# Patient Record
Sex: Female | Born: 1960 | Race: White | Hispanic: No | Marital: Married | Smoking: Former smoker
Health system: Southern US, Community
[De-identification: ages and names within clinical notes are randomized; demographics above are authoritative.]

## PROBLEM LIST (undated history)

## (undated) DIAGNOSIS — E669 Obesity, unspecified: Secondary | ICD-10-CM

## (undated) DIAGNOSIS — M1990 Unspecified osteoarthritis, unspecified site: Secondary | ICD-10-CM

## (undated) DIAGNOSIS — F32A Depression, unspecified: Secondary | ICD-10-CM

## (undated) DIAGNOSIS — G5793 Unspecified mononeuropathy of bilateral lower limbs: Secondary | ICD-10-CM

## (undated) DIAGNOSIS — K219 Gastro-esophageal reflux disease without esophagitis: Secondary | ICD-10-CM

## (undated) DIAGNOSIS — I471 Supraventricular tachycardia, unspecified: Secondary | ICD-10-CM

## (undated) DIAGNOSIS — N3010 Interstitial cystitis (chronic) without hematuria: Secondary | ICD-10-CM

## (undated) DIAGNOSIS — G8929 Other chronic pain: Secondary | ICD-10-CM

## (undated) DIAGNOSIS — M549 Dorsalgia, unspecified: Secondary | ICD-10-CM

## (undated) DIAGNOSIS — M722 Plantar fascial fibromatosis: Secondary | ICD-10-CM

## (undated) DIAGNOSIS — F329 Major depressive disorder, single episode, unspecified: Secondary | ICD-10-CM

## (undated) HISTORY — DX: Major depressive disorder, single episode, unspecified: F32.9

## (undated) HISTORY — DX: Gastro-esophageal reflux disease without esophagitis: K21.9

## (undated) HISTORY — DX: Obesity, unspecified: E66.9

## (undated) HISTORY — DX: Interstitial cystitis (chronic) without hematuria: N30.10

## (undated) HISTORY — DX: Depression, unspecified: F32.A

## (undated) HISTORY — PX: MASTECTOMY: SHX3

## (undated) NOTE — *Deleted (*Deleted)
[facility] - Clinic Note  04/26/2020     CHIEF COMPLAINT Patient presents for No chief complaint on file.   HISTORY OF PRESENT ILLNESS: Tamara Terry is a 26 y.o. female who presents to the clinic today for:     Referring physician: Benita Stabile, MD [address] [place],  [place] [address]  HISTORICAL INFORMATION:   Selected notes from the MEDICAL RECORD NUMBER Referred by Dr. Conley Rolls for eval of FOL/floaters OS x 1 wk   CURRENT MEDICATIONS: No current outpatient medications on file. (Ophthalmic Drugs)   No current facility-administered medications for this visit. (Ophthalmic Drugs)   Current Outpatient Medications (Other)  Medication Sig  . cetirizine (ZYRTEC) 10 MG tablet Take 10 mg by mouth daily.  . Fe Fum-FePoly-FA-Vit C-Vit B3 (FOLIVANE-F) 125-1 MG CAPS TAKE 1 CAPSULE BY MOUTH TWICE A DAY (EVERY 12 HOURS)  . HYDROcodone-acetaminophen (NORCO/VICODIN) 5-325 MG tablet TAKE ONE TABLET BY MOUTH EVERY FOUR HOURS  . ibuprofen (ADVIL,MOTRIN) 200 MG tablet Take 400 mg by mouth every 6 (six) hours as needed. For pain  . omeprazole (PRILOSEC) 20 MG capsule Take 1 capsule (20 mg total) by mouth 2 (two) times daily. (Patient not taking: Reported on 11/10/2019)  . omeprazole (PRILOSEC) 20 MG capsule TAKE ONE (1) CAPSULE BY MOUTH TWICE A DAY. (EVERY 12 HOURS.)  . phentermine 30 MG capsule Take 30 mg by mouth every morning.  . sertraline (ZOLOFT) 50 MG tablet Take 50 mg by mouth daily.  . traMADol (ULTRAM) 50 MG tablet Take 50 mg by mouth every evening.   No current facility-administered medications for this visit. (Other)   Facility-Administered Medications Ordered in Other Visits (Other)  Medication Route  . bupivacaine (MARCAINE) 0.5 % 15 mL, phenazopyridine (PYRIDIUM) 400 mg bladder mixture Bladder Instillation      REVIEW OF SYSTEMS:    ALLERGIES Allergies  Allergen Reactions  . Azithromycin   . Latex     PAST MEDICAL HISTORY Past Medical  History:  Diagnosis Date  . Chronic back pain   . Depression   . GERD (gastroesophageal reflux disease)   . Interstitial cystitis   . Neuropathy of both feet   . Obesity   . Plantar fasciitis, bilateral    Past Surgical History:  Procedure Laterality Date  . CHOLECYSTECTOMY  1990  . CYSTO WITH HYDRODISTENSION  11/07/2011   Procedure: CYSTOSCOPY/HYDRODISTENSION;  Surgeon: Marcine Matar, MD;  Location: [facility];  Service: Urology;  Laterality: N/A;  PYRIDIUM AND MARCAINE  . HYSTEROSCOPY WITH D & C  07-21-2004    FAMILY HISTORY Family History  Problem Relation Age of Onset  . Heart disease Mother   . Diabetes Mother   . Hypertension Mother   . Parkinson's disease Mother   . Kidney disease Mother   . Colon polyps Mother   . Heart attack Father   . Diabetes Sister   . Diabetes Maternal Grandmother   . Cancer Maternal Grandfather     SOCIAL HISTORY Social History   Tobacco Use  . Smoking status: Former Smoker    Types: Cigarettes    Quit date: 06/25/1994    Years since quitting: 25.8  . Smokeless tobacco: Never Used  Substance Use Topics  . Alcohol use: No  . Drug use: No         OPHTHALMIC EXAM:  Not recorded     IMAGING AND PROCEDURES  Imaging and Procedures for 04/26/2020  ASSESSMENT/PLAN:  No diagnosis found.  1,2. PVD / vitreous syneresis OU  - symptomatic flashes/floaters after hitting head at work  - intermittent flashes with head movement; floaters mild OU  - Discussed findings and prognosis  - No RT or RD on 360 peripheral exam  - Reviewed s/s of RT/RD  - Strict return precautions for any such RT/RD signs/symptoms  - f/u in 4-6 wks -- DFE/OCT  3. Mixed Cataract OU - The symptoms of cataract, surgical options, and treatments and risks were discussed with patient. - discussed diagnosis and progression - not yet visually significant - monitor for now  4. Hx of Lasik OU - Dr. Delaney Meigs (>20 years ago)     Ophthalmic Meds Ordered this visit:  No orders of the defined types were placed in this encounter.      No follow-ups on file.  There are no Patient Instructions on file for this visit.   Explained the diagnoses, plan, and follow up with the patient and they expressed understanding.  Patient expressed understanding of the importance of proper follow up care.  This document serves as a record of services personally performed by Karie Chimera, MD, PhD. It was created on their behalf by Cristopher Estimable, COT an ophthalmic technician. The creation of this record is the provider's dictation and/or activities during the visit.    Electronically signed by: Cristopher Estimable, COT 10.29.21 @ 9:03 AM  Abbreviations: M myopia (nearsighted); A astigmatism; H hyperopia (farsighted); P presbyopia; Mrx spectacle prescription;  CTL contact lenses; OD right eye; OS left eye; OU both eyes  XT exotropia; ET esotropia; PEK punctate epithelial keratitis; PEE punctate epithelial erosions; DES dry eye syndrome; MGD meibomian gland dysfunction; ATs artificial tears; PFAT's preservative free artificial tears; NSC nuclear sclerotic cataract; PSC posterior subcapsular cataract; ERM epi-retinal membrane; PVD posterior vitreous detachment; RD retinal detachment; DM diabetes mellitus; DR diabetic retinopathy; NPDR non-proliferative diabetic retinopathy; PDR proliferative diabetic retinopathy; CSME clinically significant macular edema; DME diabetic macular edema; dbh dot blot hemorrhages; CWS cotton wool spot; POAG primary open angle glaucoma; C/D cup-to-disc ratio; HVF humphrey visual field; GVF goldmann visual field; OCT optical coherence tomography; IOP intraocular pressure; BRVO Branch retinal vein occlusion; CRVO central retinal vein occlusion; CRAO central retinal artery occlusion; BRAO branch retinal artery occlusion; RT retinal tear; SB scleral buckle; PPV pars plana vitrectomy; VH Vitreous hemorrhage; PRP panretinal laser  photocoagulation; IVK intravitreal kenalog; VMT vitreomacular traction; MH Macular hole;  NVD neovascularization of the disc; NVE neovascularization elsewhere; AREDS age related eye disease study; ARMD age related macular degeneration; POAG primary open angle glaucoma; EBMD epithelial/anterior basement membrane dystrophy; ACIOL anterior chamber intraocular lens; IOL intraocular lens; PCIOL posterior chamber intraocular lens; Phaco/IOL phacoemulsification with intraocular lens placement; PRK photorefractive keratectomy; LASIK laser assisted in situ keratomileusis; HTN hypertension; DM diabetes mellitus; COPD chronic obstructive pulmonary disease

---

## 1988-06-25 HISTORY — PX: CHOLECYSTECTOMY: SHX55

## 2001-06-06 ENCOUNTER — Encounter: Payer: Self-pay | Admitting: *Deleted

## 2001-06-06 ENCOUNTER — Ambulatory Visit (HOSPITAL_COMMUNITY): Admission: RE | Admit: 2001-06-06 | Discharge: 2001-06-06 | Payer: Self-pay | Admitting: *Deleted

## 2002-09-09 ENCOUNTER — Encounter: Payer: Self-pay | Admitting: *Deleted

## 2002-09-09 ENCOUNTER — Ambulatory Visit (HOSPITAL_COMMUNITY): Admission: RE | Admit: 2002-09-09 | Discharge: 2002-09-09 | Payer: Self-pay | Admitting: *Deleted

## 2004-07-05 ENCOUNTER — Ambulatory Visit (HOSPITAL_COMMUNITY): Admission: RE | Admit: 2004-07-05 | Discharge: 2004-07-05 | Payer: Self-pay | Admitting: *Deleted

## 2004-07-21 ENCOUNTER — Ambulatory Visit (HOSPITAL_COMMUNITY): Admission: RE | Admit: 2004-07-21 | Discharge: 2004-07-21 | Payer: Self-pay | Admitting: *Deleted

## 2004-07-21 HISTORY — PX: HYSTEROSCOPY W/D&C: SHX1775

## 2005-10-02 ENCOUNTER — Ambulatory Visit (HOSPITAL_COMMUNITY): Admission: RE | Admit: 2005-10-02 | Discharge: 2005-10-02 | Payer: Self-pay | Admitting: *Deleted

## 2006-09-10 ENCOUNTER — Ambulatory Visit (HOSPITAL_COMMUNITY): Admission: RE | Admit: 2006-09-10 | Discharge: 2006-09-10 | Payer: Self-pay | Admitting: Internal Medicine

## 2008-02-04 ENCOUNTER — Encounter (HOSPITAL_COMMUNITY)
Admission: RE | Admit: 2008-02-04 | Discharge: 2008-03-05 | Payer: Self-pay | Admitting: Physical Medicine and Rehabilitation

## 2008-07-26 ENCOUNTER — Other Ambulatory Visit: Admission: RE | Admit: 2008-07-26 | Discharge: 2008-07-26 | Payer: Self-pay | Admitting: Obstetrics and Gynecology

## 2008-07-28 ENCOUNTER — Ambulatory Visit (HOSPITAL_COMMUNITY): Admission: RE | Admit: 2008-07-28 | Discharge: 2008-07-28 | Payer: Self-pay | Admitting: Obstetrics & Gynecology

## 2008-07-28 LAB — HM MAMMOGRAPHY

## 2009-09-07 ENCOUNTER — Other Ambulatory Visit: Admission: RE | Admit: 2009-09-07 | Discharge: 2009-09-07 | Payer: Self-pay | Admitting: Obstetrics and Gynecology

## 2010-02-01 ENCOUNTER — Ambulatory Visit (HOSPITAL_COMMUNITY): Admission: RE | Admit: 2010-02-01 | Discharge: 2010-02-01 | Payer: Self-pay | Admitting: Obstetrics & Gynecology

## 2010-08-01 ENCOUNTER — Encounter: Payer: Self-pay | Admitting: Gastroenterology

## 2010-08-01 ENCOUNTER — Ambulatory Visit (INDEPENDENT_AMBULATORY_CARE_PROVIDER_SITE_OTHER): Payer: Managed Care, Other (non HMO) | Admitting: Gastroenterology

## 2010-08-01 ENCOUNTER — Encounter: Payer: Self-pay | Admitting: Internal Medicine

## 2010-08-01 DIAGNOSIS — R1013 Epigastric pain: Secondary | ICD-10-CM | POA: Insufficient documentation

## 2010-08-01 DIAGNOSIS — R197 Diarrhea, unspecified: Secondary | ICD-10-CM

## 2010-08-04 LAB — CONVERTED CEMR LAB
Basophils Relative: 0 % (ref 0–1)
Eosinophils Relative: 1 % (ref 0–5)
Hemoglobin: 12.7 g/dL (ref 12.0–15.0)
Lymphocytes Relative: 27 % (ref 12–46)
MCHC: 32.8 g/dL (ref 30.0–36.0)
Monocytes Relative: 8 % (ref 3–12)
Platelets: 265 10*3/uL (ref 150–400)
RDW: 12.4 % (ref 11.5–15.5)
WBC: 10.6 10*3/uL — ABNORMAL HIGH (ref 4.0–10.5)

## 2010-08-10 NOTE — Letter (Signed)
Summary: TCS/EGD ORDER  TCS/EGD ORDER   Imported By: Ave Filter 08/01/2010 16:53:52  _____________________________________________________________________  External Attachment:    Type:   Image     Comment:   External Document

## 2010-08-10 NOTE — Assessment & Plan Note (Signed)
Summary: diarrhea,consult for tcs   Vital Signs:  Patient profile:   50 year old female Height:      65 inches Weight:      215 pounds Temp:     98.2 degrees F Pulse rate:   68 / minute  Visit Type:  Initial Consult Referring Provider:  Dr. Despina Hidden Primary Care Provider:  Dr. Despina Hidden   History of Present Illness: Tamara Terry is a pleasant 50 year old Caucasian female who presents today in consult for diarrhea. Pt reports a 1 year hx of postprandial diarrhea, averaging approximately 7-8 loose stools/day. Denies any lower abdominal cramping, has noted scant amount of brbpr intermittently. s/p chole many years ago. Also reports epigastric pain which radiates to back, achy and intermittent, worsened with eating/drinking. No N/V, denies reflux, dysphagia, loss of appetite. Takes Ibuprofen daily for back pain. No Goodys or BCs.   Current Medications (verified): 1)  Omeprazole 20 Mg Cpdr (Omeprazole) .... Two Times A Day 2)  Estradiol 10.5 Mg .... Daily 3)  Sertraline Hcl 50 Mg Tabs (Sertraline Hcl) .Marland Kitchen.. 1 By Mouth Daily 4)  Zyrtec Allergy 10 Mg Caps (Cetirizine Hcl) .Marland Kitchen.. 1 Daily 5)  Elmiron 100 Mg Caps (Pentosan Polysulfate Sodium) .... 4 Daily 6)  Metanx 3-35-2 Mg Tabs (L-Methylfolate-B6-B12) .... 2 Tabs Daily 7)  Uribel 118 Mg Caps (Meth-Hyo-M Bl-Na Phos-Ph Sal) .... Three Times A Day 8)  Integra Plus  Caps (Fefum-Fepoly-Fa-B Cmp-C-Biot) .... Daily 9)  Hydrocodone-Acetaminophen 5-500 Mg Tabs (Hydrocodone-Acetaminophen) .... As Needed 10)  Ginkgo Biloba 60 Mg Caps (Ginkgo Biloba) .... 2 Daily 11)  K[place] Ginseng 250 Mg Caps (Ginseng) .... 2 Tabs Daily 12)  Ibuprofen 200 Mg Caps (Ibuprofen) .... 2 Tabs Daily  Allergies (verified): 1)  ! * Azithromycin  Past History:  Past Medical History: GERD Depression/anxiety interstitial cystitis chronic back pain  Past Surgical History: Cholecystectomy Hysteroscopy  Family History: Mother:living, DM, heart disease, HTN Father:deceased,  non-contributory Family History of Colon Polyps:mother, ?adenoma, age late 57s  Social History: Occupation: self-employed Married 1 children Patient is a former smoker. quit 16 years ago Alcohol Use - no Illicit Drug Use - no Smoking Status:  quit Drug Use:  no  Review of Systems General:  Denies fever, chills, and anorexia. Eyes:  Denies blurring, irritation, and discharge. ENT:  Denies sore throat, hoarseness, and difficulty swallowing. CV:  Denies chest pains and syncope. Resp:  Denies dyspnea at rest and wheezing. GI:  Complains of abdominal pain and diarrhea; denies difficulty swallowing, pain on swallowing, nausea, jaundice, gas/bloating, and black BMs. GU:  Denies urinary burning and urinary frequency. MS:  Denies joint pain / LOM, joint swelling, and joint stiffness. Derm:  Denies rash, itching, and dry skin. Neuro:  Denies weakness and syncope. Psych:  Denies depression and anxiety. Endo:  Denies cold intolerance and heat intolerance.  Physical Exam  General:  Well developed, well nourished, no acute distress. Head:  Normocephalic and atraumatic. Eyes:  sclera without icterus.  Lungs:  Clear throughout to auscultation. Heart:  Regular rate and rhythm; no murmurs, rubs,  or bruits. Abdomen:  +BS, soft, non-tender, non-distended. no HSM< no rebound or guarding. no masses noted.  Msk:  Symmetrical with no gross deformities. Normal posture. Neurologic:  Alert and  oriented x4;  grossly normal neurologically. Skin:  Intact without significant lesions or rashes. Psych:  Alert and cooperative. Normal mood and affect.   Impression & Recommendations:  Problem # 1:  DIARRHEA (ICD-48.69) 50 year old pleasant Caucasian female with 1 year hx of  worsening postprandial diarrhea, 7-8 loose stools/day. No abdominal pain. +brbpr, scant amount. No prior colonoscopy, presenting for screening colonoscopy. May be component of IBS, possible bile salt diarrhea. Will proceed with  colonoscopy and then offer further rec's.  In interim, fiber of choice daily, samples given Probiotic daily, samples given TCS with Dr. Jena Gauss in near future: the R/B/A have been discussed in detail. stated understanding and wishes to proceed.   Orders: T-CBC w/Diff [phone]) T-Amylase [phone]) T-Lipase [phone]) T-Comprehensive Metabolic Panel [id number]) Consultation Level III (02725)  Problem # 2:  ABDOMINAL PAIN, EPIGASTRIC (ICD-789.06) +epigastric pain, radiating to back X a few months, described as achy, intermittent, worsened with eating/drinking. NSAIDs daily, likely gastritis vs PUD secondary to NSAID use. doubt pancreatic etiology. will assess baseline labs. Pt already on Omeprazole twice/day.   EGD with Dr. Jena Gauss (along with TCS) in near future: the R/B/A have been discussed in detail; she desires to proceed.  Labs as below.  Orders: T-CBC w/Diff [phone]) T-Amylase [phone]) T-Lipase [phone]) T-Comprehensive Metabolic Panel [id number]) Consultation Level III [phone])   Orders Added: 1)  T-CBC w/Diff [[id number]] 2)  T-Amylase [[id number]] 3)  T-Lipase [[id number]] 4)  T-Comprehensive Metabolic Panel [[id number]] 5)  Consultation Level III [83151]    We would like to thank Dr. Despina Hidden for the referral of this nice lady.

## 2010-08-21 ENCOUNTER — Ambulatory Visit (HOSPITAL_COMMUNITY)
Admission: RE | Admit: 2010-08-21 | Discharge: 2010-08-21 | Disposition: A | Discharge status: Home / Self Care | Payer: Managed Care, Other (non HMO) | Source: Ambulatory Visit | Attending: Internal Medicine | Admitting: Internal Medicine

## 2010-08-21 ENCOUNTER — Other Ambulatory Visit: Payer: Self-pay | Admitting: Internal Medicine

## 2010-08-21 ENCOUNTER — Encounter: Payer: Managed Care, Other (non HMO) | Admitting: Internal Medicine

## 2010-08-21 DIAGNOSIS — R1013 Epigastric pain: Secondary | ICD-10-CM | POA: Insufficient documentation

## 2010-08-21 DIAGNOSIS — R197 Diarrhea, unspecified: Secondary | ICD-10-CM

## 2010-08-21 LAB — CLOSTRIDIUM DIFFICILE BY PCR: Toxigenic C. Difficile by PCR: NEGATIVE

## 2010-08-22 LAB — FECAL LACTOFERRIN, QUANT

## 2010-08-23 LAB — OVA AND PARASITE EXAMINATION: Ova and parasites: NONE SEEN

## 2010-08-25 LAB — STOOL CULTURE

## 2010-08-30 NOTE — Op Note (Addendum)
NAME:  Tamara Terry, Tamara Terry                ACCOUNT NO.:  [id number]  MEDICAL RECORD NO.:  [id number]           PATIENT TYPE:  O  LOCATION:  [facility]YP                          FACILITY:  [facility]  PHYSICIAN:  R. Roetta Sessions, M.D. DATE OF BIRTH:  1961-03-05  DATE OF PROCEDURE:  08/21/2010 DATE OF DISCHARGE:                              OPERATIVE REPORT   PROCEDURE:  Diagnostic EGD followed by ileal colonoscopy with biopsy and stool sampling.  INDICATIONS FOR PROCEDURE:  A 50 year old lady with 1-year history of worsening postprandial diarrhea.  She has had some epigastric pain radiating into her back as well, not much improvement on omeprazole twice daily.  Also takes Elmiron for interstitial cystitis.  Recent CBC, amylase, lipase, CMP are normal except for minimally elevated total white count of 10.6.  EGD and colonoscopy now being done.  Risks, benefits, limitations, alternatives, imponderables have been discussed, questions answered.  Please see the documentation in the medical record.  PROCEDURE NOTE:  O2 saturation, blood pressure, pulse, respirations were monitored throughout the entirety of both procedures.  CONSCIOUS SEDATION:  Versed 5 mg IV, Demerol 125 mg IV in divided doses. Cetacaine spray for topical pharyngeal anesthesia.  INSTRUMENT:  Pentax video chip system.  FINDINGS:  EGD examination of tubular esophagus revealed entirely normal- appearing esophageal mucosa.  EG junction easily traversed.  Stomach:  Gas cavity was emptied and insufflated well with air. Thorough examination of gastric mucosa including retroflexion of proximal stomach, esophagogastric junction demonstrated no abnormalities.  Pylorus was patent, easily traversed.  Examination of bulb, second, and third portion revealed no abnormalities.  THERAPEUTIC/DIAGNOSTIC MANEUVERS PERFORMED:  None.  The patient tolerated the procedure well, was prepared for colonoscopy. Digital rectal exam revealed no abnormalities.   Endoscopic findings: Prep was adequate.  Colon:  Colonic mucosa was surveyed from the rectosigmoid junction through the left transverse right colon to the appendiceal orifice, ileocecal valve/cecum.  These structures were well seen and photographed for the record.  Terminal ileum was intubated to 20 cm.  From this level, scope was slowly and cautiously withdrawn.  All previously mucosal surfaces were again seen.  The colonic mucosa as well as terminal mucosa appeared entirely normal.  I took segmental biopsies of the ascending, descending, and rectal segments to rule out microscopic colitis, also fresh stool specimen was submitted to the microbiology lab.  Scope was pulled down to the rectum where a thorough examination of rectal mucosa including retroflexed view of the anal verge demonstrated no abnormalities.  The patient tolerated this procedure well.  Cecal withdrawal time 9 minutes.  IMPRESSION:  Normal esophagus, stomach, D1 through D3.  COLONOSCOPY FINDINGS:  Normal rectum:  Terminal ileum status post segmental biopsies and stool sampling.  The patient's symptoms certainly could be related to irritable bowel syndrome.  Drug-induced diarrhea also possibility (i.e. omeprazole, ibuprofen, and even Elmiron, the latter associated with colitis).  RECOMMENDATIONS:  We will follow up on pending studies, then we will render further recommendations in the very near future.     Jonathon Bellows, M.D.     RMR/MEDQ  D:  08/21/2010  T:  08/21/2010  Job:  [id number]  cc:   Lazaro Arms, M.D. Fax: [phone]  Electronically Signed by Lorrin Goodell M.D. on 08/29/2010 02:42:10 PM Electronically Signed by Lorrin Goodell M.D. on 08/29/2010 51:76:16 PM Electronically Signed by Lorrin Goodell M.D. on 08/29/2010 03:31:11 PM Electronically Signed by Lorrin Goodell M.D. on 08/29/2010 04:05:17 PM Electronically Signed by Lorrin Goodell M.D. on 08/29/2010 04:36:36 PM Electronically Signed by Lorrin Goodell M.D. on 08/29/2010  05:08:10 PM Electronically Signed by Lorrin Goodell M.D. on 08/29/2010 05:08:10 PM Electronically Signed by Lorrin Goodell M.D. on 08/29/2010 05:36:36 PM Electronically Signed by Lorrin Goodell M.D. on 08/29/2010 06:26:06 PM Electronically Signed by Lorrin Goodell M.D. on 08/29/2010 07:36:58 PM

## 2010-09-26 ENCOUNTER — Encounter: Payer: Self-pay | Admitting: Gastroenterology

## 2010-09-27 ENCOUNTER — Ambulatory Visit: Payer: Managed Care, Other (non HMO) | Admitting: Gastroenterology

## 2010-11-10 NOTE — Op Note (Signed)
Tamara Terry                ACCOUNT NO.:  [id number]   MEDICAL RECORD NO.:  [id number]          PATIENT TYPE:  AMB   LOCATION:  DAY                           FACILITY:  [facility]   PHYSICIAN:  Langley Gauss, MD     DATE OF BIRTH:  1960-11-02   DATE OF PROCEDURE:  07/21/2004  DATE OF DISCHARGE:  07/21/2004                                 OPERATIVE REPORT   PREOPERATIVE DIAGNOSES:  1.  Menometrorrhagia.  2.  Dysmenorrhea.   OPERATION/PROCEDURE:  1.  Hysteroscopy.  2.  Dilatation and curettage.   SURGEON:  Langley Gauss, M.D.   ESTIMATED BLOOD LOSS:  Minimal.   SPECIMENS:  Uterine curettings as well as portion of endometrial lining  removed with Randall-Stone forceps for permanent pathology only.   DESCRIPTION OF PROCEDURE:  The patient was taken to the operating room.  Vital signs were stable. The patient underwent uncomplicated induction of  general anesthesia.  She was then placed in the low lithotomy position,  prepped and draped in the usual sterile manner. Speculum was placed in  vaginal vault.  The cervix was visualized.  Initially cervix and uterus  sounded only to a depth of 6 cm, but there was noted to be very irregular  contour of the intrauterine cavity consistent with significant endometrial  tissue present.  Progressive dilatation was then performed up to a size #27  dilator which then does allow passage of the hysteroscope into the lower  uterine segment and uterine cavity.  Under direct visualization appropriate  photographs were taken.  There was dense, thick, endometrial tissue present  with suggestion of a polyp.  Some of this tissue does feel to be dense  enough to be causing loculation.  Throughout the procedure, sterile normal  saline is utilized as diluting medium.  The cystoscope utilized.  Those  results in some limitation of the procedure performed as no biopsy forceps  were available which passed through the lumen and __________ no scissors are  available either. Only instrument which was passed was a very small flexible  ureteral scissors which were insufficiency for obtaining tissue.  Thus,  photographs are taken after which time the hysteroscope is removed and  aggressive curettage is performed of the uterine cavity.  We found it quite  evident that the tissue is very dense and thick with very irregular  intrauterine contour.  Aggressive curettage is performed with large amounts  of tissue obtained.  Curettage is continued until a fine gritty sensation is  appreciated in all quadrants of the uterus and through the uterine fundus.  The hysteroscope was then reinserted which was noted to reveal significant  amounts of the endometrial tissue still present.  Thus, the hysteroscope was  removed.  The Randall-Stone forceps were utilized at which time complete  intrauterine exploration is performed resulting in large amounts of  additional tissue being removed and all sent for permanent section.  Curettage was then again performed with additional tissue obtained.  This  was continued until no further endometrial tissue is obtained.  The  hysteroscope is then reinserted utilizing sterile normal  saline as the  exploration medium.  At this point in time, there are minimal abnormalities  identified.  The endometrial lining is largely curettaged down to near the  basal layer and a fairly smooth intrauterine contour is appreciated.  Uterus  at this point in time is noted to sound to a depth of 10 cm.  There were no  complications which have occurred.  The hysteroscope is then removed.  The  patient is noted to have a first degree cystocele.  There is very excellent  suspension of the uterus with very gentle traction on the cervix.  Bimanual  examination reveals a multiparous size uterus but no adnexal masses.  Excellent hemostasis noted following the procedure. All instruments were  removed.  The patient is reversed from anesthesia.  The  patient was taken to  the recovery room in stable condition.   SUMMARY:  Immediately following the procedure, the operative findings were  discussed with the patient's awaiting husband. Final pathology is currently  pending.  The patient history and physical findings are consistent with  chronic and obvious __________ cycles with unopposed estrogen resulting in  thickening of the endometrial tissue.  I discussed with the patient's  husband that we may do well to perform serial Provera withdrawal in an  effort to regulate the menses.   LABORATORY DATA:  The patient is noted to have a hemoglobin of 12.1, beta  hCG is negative.  She is given a copy of the standard discharge  instructions.  Next scheduled date of work being July 24, 2004 will be  dependent on the patient's overall physical status at that time.  She  however, will otherwise follow up in the office in one week's time.   DISCHARGE MEDICATIONS:  The patient did receive 1 g of IV Ancef immediately  preoperatively.  She was given prescription for Keflex 500 mg p.o. b.i.d.  for seven days and in addition ibuprofen one p.o. q.6h. p.r.n. for  postoperative pelvic cramping.   Postoperatively the patient has no complications.  Vital signs remain  stable.  The patient was discharged to home on same date of service, July 21, 2004.      DC/MEDQ  D:  07/22/2004  T:  07/24/2004  Job:  [id number]

## 2011-04-20 ENCOUNTER — Other Ambulatory Visit: Payer: Self-pay

## 2011-04-20 ENCOUNTER — Encounter (HOSPITAL_COMMUNITY): Payer: Self-pay | Admitting: *Deleted

## 2011-04-20 ENCOUNTER — Emergency Department (HOSPITAL_COMMUNITY): Payer: Managed Care, Other (non HMO)

## 2011-04-20 ENCOUNTER — Emergency Department (HOSPITAL_COMMUNITY)
Admission: EM | Admit: 2011-04-20 | Discharge: 2011-04-20 | Disposition: A | Discharge status: Home / Self Care | Payer: Managed Care, Other (non HMO) | Attending: Emergency Medicine | Admitting: Emergency Medicine

## 2011-04-20 DIAGNOSIS — M542 Cervicalgia: Secondary | ICD-10-CM | POA: Insufficient documentation

## 2011-04-20 DIAGNOSIS — R079 Chest pain, unspecified: Secondary | ICD-10-CM | POA: Insufficient documentation

## 2011-04-20 DIAGNOSIS — F3289 Other specified depressive episodes: Secondary | ICD-10-CM | POA: Insufficient documentation

## 2011-04-20 DIAGNOSIS — M62838 Other muscle spasm: Secondary | ICD-10-CM

## 2011-04-20 DIAGNOSIS — K219 Gastro-esophageal reflux disease without esophagitis: Secondary | ICD-10-CM | POA: Insufficient documentation

## 2011-04-20 DIAGNOSIS — Z87891 Personal history of nicotine dependence: Secondary | ICD-10-CM | POA: Insufficient documentation

## 2011-04-20 DIAGNOSIS — F329 Major depressive disorder, single episode, unspecified: Secondary | ICD-10-CM | POA: Insufficient documentation

## 2011-04-20 DIAGNOSIS — M549 Dorsalgia, unspecified: Secondary | ICD-10-CM | POA: Insufficient documentation

## 2011-04-20 DIAGNOSIS — G8929 Other chronic pain: Secondary | ICD-10-CM | POA: Insufficient documentation

## 2011-04-20 LAB — BASIC METABOLIC PANEL
Calcium: 10.5 mg/dL (ref 8.4–10.5)
Chloride: 103 mEq/L (ref 96–112)
Glucose, Bld: 94 mg/dL (ref 70–99)

## 2011-04-20 LAB — CBC
HCT: 37.2 % (ref 36.0–46.0)
RBC: 3.98 MIL/uL (ref 3.87–5.11)
RDW: 11.9 % (ref 11.5–15.5)

## 2011-04-20 LAB — POCT I-STAT TROPONIN I: Troponin i, poc: 0 ng/mL (ref 0.00–0.08)

## 2011-04-20 MED ORDER — METHOCARBAMOL 500 MG PO TABS: 1000.0000 mg | ORAL_TABLET | Freq: Four times a day (QID) | ORAL | Status: AC | PRN

## 2011-04-20 MED ORDER — OXYCODONE-ACETAMINOPHEN 5-325 MG PO TABS
1.0000 | ORAL_TABLET | Freq: Once | ORAL | Status: AC
  Administered 2011-04-20: 1 via ORAL
  Filled 2011-04-20: qty 1

## 2011-04-20 MED ORDER — KETOROLAC TROMETHAMINE 30 MG/ML IJ SOLN
15.0000 mg | Freq: Once | INTRAMUSCULAR | Status: AC
  Administered 2011-04-20: 15 mg via INTRAVENOUS
  Filled 2011-04-20: qty 1

## 2011-04-20 NOTE — ED Provider Notes (Signed)
Scribed for [name], DO, the patient was seen in room APA09/APA09 . This chart was scribed by Ellie Lunch.   CSN: [id number] Arrival date & time: 04/20/2011 11:43 AM   Chief Complaint  Patient presents with  . Left neck/ arm pain     The history is provided by the patient. No language interpreter was used.   Pt seen at 2:00.   Tamara Terry is a 50 y.o. female who presents to the Emergency Department complaining of gradual onset and persistence of constant left sided neck pain x2 days. 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  Pt describes pain as "sharp," "cramping," and says it radiates into her left upper anterior chest and shoulder/upper arm. Pt also reports some associated SOB this morning.  Pt treated pain with ibuprofen this morning with little relief. Pt has hx of same symptoms today.  Denies recent injury, no manipulation, no fevers, no rash, no palpitations, no cough, no back pain, no abd pain, no focal motor weakness, no tingling/numbness in extremities, no visual changes.      Past Medical History  Diagnosis Date  . GERD (gastroesophageal reflux disease)   . Depression   . Interstitial cystitis   . Back pain     chronic    Past Surgical History  Procedure Date  . Cholecystectomy   . Hysteroscopy     Family History  Problem Relation Age of Onset  . Heart disease Mother   . Diabetes Mother   . Hypertension Mother   . Colon polyps Other     History  Substance Use Topics  . Smoking status: Former Smoker    Quit date: 06/25/1994  . Smokeless tobacco: Not on file  . Alcohol Use: No   Review of Systems ROS: Statement: All systems negative except as marked or noted in the HPI; Constitutional: Negative for fever and chills. ; ; Eyes: Negative for eye pain, redness and discharge. ; ; ENMT: Negative for ear pain, hoarseness, nasal congestion, sinus pressure and sore throat. ; ;  Cardiovascular: +SOB. Negative for chest pain, palpitations, diaphoresis, and peripheral edema. ; ; Respiratory: Negative for cough, wheezing and stridor. ; ; Gastrointestinal: Negative for nausea, vomiting, diarrhea and abdominal pain, blood in stool, hematemesis, jaundice and rectal bleeding. . ; ; Genitourinary: Negative for dysuria, flank pain and hematuria. ; ; Musculoskeletal: Negative for back pain and +neck pain. Negative for swelling and trauma.; ; Skin: Negative for pruritus, rash, abrasions, blisters, bruising and skin lesion.; ; Neuro: Negative for headache, lightheadedness and neck stiffness. Negative for weakness, altered level of consciousness , altered mental status, extremity weakness, paresthesias, involuntary movement, seizure and syncope.    Allergies  Azithromycin and Latex  Home Medications   Current Outpatient Rx  Name Route Sig Dispense Refill  . CETIRIZINE HCL 10 MG PO CHEW Oral Chew 10 mg by mouth daily.      Marland Kitchen ESTRADIOL 1.5 MG PO TABS Oral Take 1.5 mg by mouth daily.      Arnette Schaumann PLUS PO CAPS Oral Take 1 capsule by mouth daily.      Marland Kitchen GINKGO 60 MG PO TABS Oral Take 120 mg by mouth daily.      Marland Kitchen HYDROCODONE-ACETAMINOPHEN 5-500 MG PO TABS Oral Take 1 tablet by mouth every 6 (six) hours as needed.      . IBUPROFEN 200 MG PO TABS Oral Take 400 mg by mouth every 6 (  six) hours as needed.      Marland Kitchen KOREAN GINSENG 250 MG PO CAPS Oral Take 2 capsules by mouth daily.      Marland Kitchen METANX 3-35-2 MG PO TABS Oral Take 2 tablets by mouth daily.      Marland Kitchen URIBEL 118 MG PO CAPS Oral Take 1 capsule by mouth 3 (three) times daily.      Marland Kitchen OMEPRAZOLE 20 MG PO CPDR Oral Take 20 mg by mouth daily.      Marland Kitchen PENTOSAN POLYSULFATE SODIUM 100 MG PO CAPS Oral Take 100 mg by mouth 4 (four) times daily.      . SERTRALINE HCL 50 MG PO TABS Oral Take 50 mg by mouth daily.        BP 98/61  Pulse 56  Temp(Src) 97.7 F (36.5 C) (Oral)  Resp 20  Ht 5\' 6"  (1.676 m)  Wt 220 lb (99.791 kg)  BMI 35.51 kg/m2   SpO2 98%  Physical Exam 1405:  Physical examination:  Nursing notes reviewed; Vital signs and O2 SAT reviewed;  Constitutional: Well developed, Well nourished, Well hydrated, In no acute distress; Head:  Normocephalic, atraumatic; Eyes: EOMI, PERRL, No scleral icterus; ENMT: Mouth and pharynx normal, Mucous membranes moist; Neck: Supple, Full range of motion, No lymphadenopathy; Cardiovascular: Regular rate and rhythm, No murmur, rub, or gallop; Respiratory: Breath sounds clear & equal bilaterally, No rales, rhonchi, wheezes, or rub, Normal respiratory effort/excursion; Chest: +TTP left upper chest wall area.  No soft tissue crepitus, Movement normal; Abdomen: Soft, Nontender, Nondistended, Normal bowel sounds; Spine:  No midline CS, TS, LS tenderness.  +TTP left hypertonic trapezius muscle.  Extremities: Pulses normal, No tenderness, No edema, No calf edema or asymmetry.; Neuro: AA&Ox3, Major CN grossly intact.  No facial droop, speech clear.  No gross focal motor or sensory deficits in extremities.; Skin: Color normal, Warm, Dry, no rash, no petechiae.  Psych:  Affect flat, poor eye contact.    ED Course  Procedures   MDM  MDM Reviewed: nursing note and vitals Interpretation: ECG, labs, x-ray and CT scan    Date: 04/20/2011  Rate: 59  Rhythm: normal sinus rhythm  QRS Axis: normal  Intervals: normal  ST/T Wave abnormalities: normal  Conduction Disutrbances:none  Narrative Interpretation:   Old EKG Reviewed: none available.   Results for orders placed during the hospital encounter of 04/20/11  CBC      Component Value Range   WBC 8.2  4.0 - 10.5 (K/uL)   RBC 3.98  3.87 - 5.11 (MIL/uL)   Hemoglobin 12.5  12.0 - 15.0 (g/dL)   HCT 40.9  81.1 - 91.4 (%)   MCV 93.5  78.0 - 100.0 (fL)   MCH 31.4  26.0 - 34.0 (pg)   MCHC 33.6  30.0 - 36.0 (g/dL)   RDW 78.2  95.6 - 21.3 (%)   Platelets 264  150 - 400 (K/uL)  BASIC METABOLIC PANEL      Component Value Range   Sodium 139  135 - 145  (mEq/L)   Potassium 3.8  3.5 - 5.1 (mEq/L)   Chloride 103  96 - 112 (mEq/L)   CO2 28  19 - 32 (mEq/L)   Glucose, Bld 94  70 - 99 (mg/dL)   BUN 16  6 - 23 (mg/dL)   Creatinine, Ser 0.86  0.50 - 1.10 (mg/dL)   Calcium 57.8  8.4 - 10.5 (mg/dL)   GFR calc non Af Amer >90  >90 (mL/min)   GFR calc Af  Amer >90  >90 (mL/min)  POCT I-STAT TROPONIN I      Component Value Range   Troponin i, poc 0.00  0.00 - 0.08 (ng/mL)   Comment 3           D-DIMER, QUANTITATIVE      Component Value Range   D-Dimer, Quant 0.25  0.00 - 0.48 (ug/mL-FEU)   Dg Chest 2 View  04/20/2011  *RADIOLOGY REPORT*  Clinical Data: Left chest pain.  CHEST - 2 VIEW  Comparison: None.  Findings: Lungs are clear.  Heart size is normal.  No pneumothorax or effusion.  IMPRESSION: No acute disease.  Original Report Authenticated By: Bernadene Bell. Maricela Curet, M.D.   Ct Cervical Spine Wo Contrast  04/20/2011  *RADIOLOGY REPORT*  Clinical Data: Left-sided neck pain that radiates to the left arm.  CT CERVICAL SPINE WITHOUT CONTRAST  Technique:  Multidetector CT imaging of the cervical spine was performed. Multiplanar CT image reconstructions were also generated.  Comparison: None.  Findings: The lung apices are clear.  No evidence for acute fracture or dislocation.  Visualized mastoid air cells are clear. Normal alignment of the cervical spine including the cervicothoracic junction.  Mild degenerative facet changes in the upper cervical spine.  No gross abnormality in the posterior fossa of the brain.  No gross soft tissue abnormalities in the neck. There is no significant bony foraminal narrowing.  IMPRESSION: Mild degenerative changes in the cervical spine.  No acute bony abnormality.  Original Report Authenticated By: Richarda Overlie, M.D.   3:42 PM:  Feels improved after meds and wants to go home now.  Appears msk pain at this time, states "that's what I thought it was."  Endorses hx of same, "but I just wanted to get it checked out."  Dx testing  d/w pt and family.  Questions answered.  Verb understanding, agreeable to d/c home with outpt f/u.   [facility] M  I personally performed the services described in this documentation, which was scribed in my presence. The recorded information has been reviewed and considered.        [name], DO 04/20/11 Flossie Buffy

## 2011-04-20 NOTE — ED Notes (Signed)
Left neck and left arm pain x 2 days. Pt states she became clammy and short of breath at 1000, lasting for 45 min. While at work. Pt  States current pain to neck and arm at 5. Denies pain to chest at this time. Pain rated at 5. No known injury.

## 2011-04-20 NOTE — ED Notes (Signed)
Going Home!

## 2011-04-20 NOTE — ED Notes (Signed)
Gave warm blanket, complained of being cold.

## 2011-04-26 ENCOUNTER — Other Ambulatory Visit (HOSPITAL_COMMUNITY)
Admission: RE | Admit: 2011-04-26 | Discharge: 2011-04-26 | Disposition: A | Discharge status: Home / Self Care | Payer: Managed Care, Other (non HMO) | Source: Ambulatory Visit | Attending: Obstetrics and Gynecology | Admitting: Obstetrics and Gynecology

## 2011-04-26 ENCOUNTER — Other Ambulatory Visit: Payer: Self-pay | Admitting: Adult Health

## 2011-04-26 DIAGNOSIS — Z01419 Encounter for gynecological examination (general) (routine) without abnormal findings: Secondary | ICD-10-CM | POA: Insufficient documentation

## 2011-10-29 ENCOUNTER — Other Ambulatory Visit: Payer: Self-pay | Admitting: Urology

## 2011-10-31 MED ORDER — PHENAZOPYRIDINE HCL 200 MG PO TABS: Freq: Once | ORAL | Status: AC

## 2011-11-02 ENCOUNTER — Encounter (HOSPITAL_BASED_OUTPATIENT_CLINIC_OR_DEPARTMENT_OTHER): Payer: Self-pay | Admitting: *Deleted

## 2011-11-05 ENCOUNTER — Encounter (HOSPITAL_BASED_OUTPATIENT_CLINIC_OR_DEPARTMENT_OTHER): Payer: Self-pay | Admitting: *Deleted

## 2011-11-05 NOTE — Progress Notes (Signed)
NPO AFTER MN. ARRIVES AT 1115. NEEDS HG. WILL TAKE PRILOSEC AM OF SURG. W/ SIP OF WATER.

## 2011-11-06 NOTE — H&P (Signed)
H&P  Chief Complaint: Female pelvic pain  History of Present Illness: Tamara Terry is a 51 y.o. year old female with the following history:  History of Present Illness  This 51 year old female originally saw me on 10/26/2011 for evaluation and management of urinary and pelvic symptoms. She has been told over the past 4 years or so that she had interstitial cystitis. She sees Dr. Despina Hidden in [place], who is her gynecologist. 4 years ago, she began having right pelvic/lower bowel pain, as well as frequency, urgency and dysuria. She has had intermittent but persistent symptoms since that time. She has urinary frequency up to 20-30 times a day, nocturia x6-7, and has dyspareunia, which has precluded intercourse for the past few years. She has been on uribel 4 times daily, as well as Elmiron. She has not had improvement. Apparently, she has had dietary instruction, and follows a fairly strict diet. She denies significant bowel symptoms. She denies gross hematuria. She was treated for UTI recently. She also states that she has been on DMSO treatments as well, about every 6 weeks. Apparently, these work better initially then recently.   She has not seen a urologist before for this. PUF score is 35.  At her office visit then, it was recommended that she have cystoscopy and HOD. She presents at this time for that procedure.    Past Medical History  Diagnosis Date  . GERD (gastroesophageal reflux disease)   . Depression   . Interstitial cystitis   . Neuropathy of both feet   . Chronic back pain   . Plantar fasciitis, bilateral     Past Surgical History  Procedure Date  . Hysteroscopy w/d&c 07-21-2004  . Cholecystectomy 1990    Home Medications:  No prescriptions prior to admission    Allergies:  Allergies  Allergen Reactions  . Azithromycin   . Latex     Family History  Problem Relation Age of Onset  . Heart disease Mother   . Diabetes Mother   . Hypertension Mother   . Colon polyps  Other     Social History:  reports that she quit smoking about 17 years ago. She has never used smokeless tobacco. She reports that she does not drink alcohol or use illicit drugs.   Review of Systems  Genitourinary: urinary frequency, urinary urgency, dysuria, nocturia and incontinence.  Gastrointestinal: diarrhea.  Constitutional: night sweats and feeling tired (fatigue).  Musculoskeletal: back pain.     Physical Exam Constitutional: Well nourished and well developed . No acute distress.  ENT:. The ears and nose are normal in appearance.  Neck: The appearance of the neck is normal and no neck mass is present.  Pulmonary: No respiratory distress and normal respiratory rhythm and effort.  Cardiovascular: Heart rate and rhythm are normal . No peripheral edema.  Abdomen: The abdomen is mildly obese. The abdomen is soft and nontender. No masses are palpated. No CVA tenderness. No hernias are palpable. No hepatosplenomegaly noted.  Genitourinary: Examination of the external genitalia shows normal female external genitalia and no lesions. The urethra is normal in appearance. There is no urethral mass. Vaginal exam demonstrates no abnormalities and the vaginal epithelium to be well estrogenized. No cystocele is identified. No rectocele is identified. The cervix is is without abnormalities. The uterus is without abnormalities and non tender. The bladder is normal on palpation and non tender. The anus is normal on inspection. The perineum is normal on inspection.  The patient's pelvic floor was tender. I did  not appreciate any specific bladder tenderness, however.     Laboratory Data:  No results found for this or any previous visit (from the past 24 hour(s)). No results found for this or any previous visit (from the past 240 hour(s)). Creatinine: No results found for this basename: CREATININE:7 in the last 168 hours  Radiologic Imaging: No results found.  Impression/Assessment:  Possible  interstitial cystitis  Plan:  Cystoscopy/HOD, instill marcaine and pyridium  Chelsea Aus 11/06/2011, 9:40 PM  Bertram Millard. Leahann Lempke MD

## 2011-11-07 ENCOUNTER — Ambulatory Visit (HOSPITAL_BASED_OUTPATIENT_CLINIC_OR_DEPARTMENT_OTHER): Payer: Managed Care, Other (non HMO) | Admitting: Anesthesiology

## 2011-11-07 ENCOUNTER — Encounter (HOSPITAL_BASED_OUTPATIENT_CLINIC_OR_DEPARTMENT_OTHER): Payer: Self-pay | Admitting: *Deleted

## 2011-11-07 ENCOUNTER — Encounter (HOSPITAL_BASED_OUTPATIENT_CLINIC_OR_DEPARTMENT_OTHER): Payer: Self-pay | Admitting: Anesthesiology

## 2011-11-07 ENCOUNTER — Encounter (HOSPITAL_BASED_OUTPATIENT_CLINIC_OR_DEPARTMENT_OTHER)
Admission: RE | Disposition: A | Discharge status: Home / Self Care | Payer: Self-pay | Source: Ambulatory Visit | Attending: Urology

## 2011-11-07 ENCOUNTER — Ambulatory Visit (HOSPITAL_BASED_OUTPATIENT_CLINIC_OR_DEPARTMENT_OTHER)
Admission: RE | Admit: 2011-11-07 | Discharge: 2011-11-07 | Disposition: A | Discharge status: Home / Self Care | Payer: Managed Care, Other (non HMO) | Source: Ambulatory Visit | Attending: Urology | Admitting: Urology

## 2011-11-07 DIAGNOSIS — N3010 Interstitial cystitis (chronic) without hematuria: Secondary | ICD-10-CM | POA: Insufficient documentation

## 2011-11-07 DIAGNOSIS — K219 Gastro-esophageal reflux disease without esophagitis: Secondary | ICD-10-CM | POA: Insufficient documentation

## 2011-11-07 HISTORY — DX: Dorsalgia, unspecified: M54.9

## 2011-11-07 HISTORY — DX: Plantar fascial fibromatosis: M72.2

## 2011-11-07 HISTORY — DX: Other chronic pain: G89.29

## 2011-11-07 HISTORY — DX: Unspecified mononeuropathy of bilateral lower limbs: G57.93

## 2011-11-07 HISTORY — PX: CYSTO WITH HYDRODISTENSION: SHX5453

## 2011-11-07 SURGERY — CYSTOSCOPY, WITH BLADDER HYDRODISTENSION
Anesthesia: General | Site: Bladder | Wound class: Clean Contaminated

## 2011-11-07 MED ORDER — ACETAMINOPHEN 650 MG RE SUPP: 650.0000 mg | RECTAL | Status: DC | PRN

## 2011-11-07 MED ORDER — MORPHINE SULFATE 4 MG/ML IJ SOLN: 4.0000 mg | INTRAMUSCULAR | Status: DC | PRN

## 2011-11-07 MED ORDER — FENTANYL CITRATE 0.05 MG/ML IJ SOLN: 25.0000 ug | INTRAMUSCULAR | Status: DC | PRN

## 2011-11-07 MED ORDER — LACTATED RINGERS IV SOLN: INTRAVENOUS | Status: DC

## 2011-11-07 MED ORDER — FENTANYL CITRATE 0.05 MG/ML IJ SOLN
INTRAMUSCULAR | Status: DC | PRN
  Administered 2011-11-07: 25 ug via INTRAVENOUS
  Administered 2011-11-07: 50 ug via INTRAVENOUS
  Administered 2011-11-07: 25 ug via INTRAVENOUS

## 2011-11-07 MED ORDER — ONDANSETRON HCL 4 MG/2ML IJ SOLN
INTRAMUSCULAR | Status: DC | PRN
  Administered 2011-11-07: 4 mg via INTRAVENOUS

## 2011-11-07 MED ORDER — PHENAZOPYRIDINE HCL 200 MG PO TABS
ORAL | Status: DC | PRN
  Administered 2011-11-07: 13:00:00 via INTRAVESICAL

## 2011-11-07 MED ORDER — LIDOCAINE HCL (CARDIAC) 20 MG/ML IV SOLN
INTRAVENOUS | Status: DC | PRN
  Administered 2011-11-07: 60 mg via INTRAVENOUS

## 2011-11-07 MED ORDER — SODIUM CHLORIDE 0.9 % IV SOLN: 250.0000 mL | INTRAVENOUS | Status: DC | PRN

## 2011-11-07 MED ORDER — SODIUM CHLORIDE 0.9 % IJ SOLN: 3.0000 mL | INTRAMUSCULAR | Status: DC | PRN

## 2011-11-07 MED ORDER — DEXAMETHASONE SODIUM PHOSPHATE 4 MG/ML IJ SOLN
INTRAMUSCULAR | Status: DC | PRN
  Administered 2011-11-07: 8 mg via INTRAVENOUS

## 2011-11-07 MED ORDER — CEFAZOLIN SODIUM 1-5 GM-% IV SOLN: 1.0000 g | INTRAVENOUS | Status: DC

## 2011-11-07 MED ORDER — SODIUM CHLORIDE 0.9 % IJ SOLN: 3.0000 mL | Freq: Two times a day (BID) | INTRAMUSCULAR | Status: DC

## 2011-11-07 MED ORDER — PROMETHAZINE HCL 25 MG/ML IJ SOLN: 6.2500 mg | INTRAMUSCULAR | Status: DC | PRN

## 2011-11-07 MED ORDER — STERILE WATER FOR IRRIGATION IR SOLN
Status: DC | PRN
  Administered 2011-11-07: 3000 mL

## 2011-11-07 MED ORDER — PROPOFOL 10 MG/ML IV EMUL
INTRAVENOUS | Status: DC | PRN
  Administered 2011-11-07: 200 mg via INTRAVENOUS

## 2011-11-07 MED ORDER — OXYCODONE-ACETAMINOPHEN 5-325 MG PO TABS: 1.0000 | ORAL_TABLET | ORAL | Status: AC | PRN

## 2011-11-07 MED ORDER — ACETAMINOPHEN 325 MG PO TABS: 650.0000 mg | ORAL_TABLET | ORAL | Status: DC | PRN

## 2011-11-07 MED ORDER — LACTATED RINGERS IV SOLN
INTRAVENOUS | Status: DC
  Administered 2011-11-07: 100 mL/h via INTRAVENOUS
  Administered 2011-11-07: 12:00:00 via INTRAVENOUS

## 2011-11-07 MED ORDER — ONDANSETRON HCL 4 MG/2ML IJ SOLN: 4.0000 mg | Freq: Four times a day (QID) | INTRAMUSCULAR | Status: DC | PRN

## 2011-11-07 MED ORDER — KETOROLAC TROMETHAMINE 30 MG/ML IJ SOLN
INTRAMUSCULAR | Status: DC | PRN
  Administered 2011-11-07: 30 mg via INTRAVENOUS

## 2011-11-07 MED ORDER — CEFAZOLIN SODIUM-DEXTROSE 2-3 GM-% IV SOLR
2.0000 g | INTRAVENOUS | Status: AC
  Administered 2011-11-07: 2 g via INTRAVENOUS

## 2011-11-07 MED ORDER — BELLADONNA ALKALOIDS-OPIUM 16.2-60 MG RE SUPP
RECTAL | Status: DC | PRN
  Administered 2011-11-07: 1 via RECTAL

## 2011-11-07 MED ORDER — MIDAZOLAM HCL 5 MG/5ML IJ SOLN
INTRAMUSCULAR | Status: DC | PRN
  Administered 2011-11-07: 2 mg via INTRAVENOUS

## 2011-11-07 MED ORDER — OXYCODONE HCL 5 MG PO TABS
5.0000 mg | ORAL_TABLET | ORAL | Status: DC | PRN
  Administered 2011-11-07: 5 mg via ORAL

## 2011-11-07 SURGICAL SUPPLY — 22 items
BAG DRAIN URO-CYSTO SKYTR STRL (DRAIN) ×2 IMPLANT
BAG DRN UROCATH (DRAIN) ×1
CANISTER SUCT LVC 12 LTR MEDI- (MISCELLANEOUS) ×1 IMPLANT
CATH SILICONE 16FRX5CC (CATHETERS) ×1 IMPLANT
CLOTH BEACON ORANGE TIMEOUT ST (SAFETY) ×2 IMPLANT
DRAPE CAMERA CLOSED 9X96 (DRAPES) ×2 IMPLANT
ELECT REM PT RETURN 9FT ADLT (ELECTROSURGICAL)
ELECTRODE REM PT RTRN 9FT ADLT (ELECTROSURGICAL) ×1 IMPLANT
GLOVE BIO SURGEON STRL SZ8 (GLOVE) ×2 IMPLANT
GLOVE BIOGEL PI IND STRL 6.5 (GLOVE) IMPLANT
GLOVE BIOGEL PI INDICATOR 6.5 (GLOVE) ×2
GLOVE SURG SS PI 8.0 STRL IVOR (GLOVE) ×1 IMPLANT
GOWN STRL REIN XL XLG (GOWN DISPOSABLE) ×2 IMPLANT
GOWN SURGICAL LARGE (GOWNS) ×1 IMPLANT
GOWN XL W/COTTON TOWEL STD (GOWNS) ×2 IMPLANT
NDL SAFETY ECLIPSE 18X1.5 (NEEDLE) IMPLANT
NEEDLE HYPO 18GX1.5 SHARP (NEEDLE) ×2
NEEDLE HYPO 22GX1.5 SAFETY (NEEDLE) IMPLANT
NS IRRIG 500ML POUR BTL (IV SOLUTION) IMPLANT
PACK CYSTOSCOPY (CUSTOM PROCEDURE TRAY) ×2 IMPLANT
SYR 20CC LL (SYRINGE) ×1 IMPLANT
WATER STERILE IRR 3000ML UROMA (IV SOLUTION) ×2 IMPLANT

## 2011-11-07 NOTE — Op Note (Signed)
Preoperative diagnosis: Possible interstitial cystitis  Postoperative diagnosis: Interstitial cystitis  Procedure: Anesthetic cystoscopy, HOD, installation of gradient/Marcaine  Surgeon: Vivyan Biggers  Anesthesia: Gen. LMA  Complications: None  Indications: 51 year-old female with pelvic pain and lower urinary tract symptoms. She has been treated for some time for interstitial cystitis. She has had incomplete response to conservative therapy. She recently consult with me, her first visit to a urologist recently. I recommended cystoscopy and HOD. Risks and complications of the procedure were discussed with the patient who desires to proceed.  Description of procedure: The patient was identified in the holding area and received preoperative IV antibiotics. She was taken the operating room where general anesthetic was administered using the LMA. She was placed in the dorsolithotomy position. Genitalia and perineum were prepped and draped. Timeout was then performed.  Pelvic exam revealed a normal feeling uterus and cervix. There were no pelvic masses. A 22 French panendoscope was advanced into her bladder. The bladder was inspected circumferentially. Ureteral orifices were normal in configuration and location. There were no significant trabeculations foreign bodies or urothelial abnormalities. Following this, with the bag of irrigant 700 mm over her bladder level, her bladder was filled to capacity. The bladder was left filled for approximately 8 minutes. Following this, the bladder was drained. Approximately 900 cc of water was obtained from the bladder. Inspection of the bladder revealed a few glomerulations. There was no active bleeding. No biopsies were taken. At this point, the bladder was drained and the peridium/Marcaine solution was placed within the bladder. The patient was awakened following a B&O suppository being place. She was taken the PACU in stable condition.

## 2011-11-07 NOTE — Transfer of Care (Signed)
Immediate Anesthesia Transfer of Care Note  Patient: Tamara Terry  Procedure(s) Performed: Procedure(s) (LRB): CYSTOSCOPY/HYDRODISTENSION (N/A)  Patient Location: PACU  Anesthesia Type: General  Level of Consciousness: awake, oriented, sedated and patient cooperative  Airway & Oxygen Therapy: Patient Spontanous Breathing and Patient connected to face mask oxygen  Post-op Assessment: Report given to PACU RN and Post -op Vital signs reviewed and stable  Post vital signs: Reviewed and stable  Complications: No apparent anesthesia complications

## 2011-11-07 NOTE — Anesthesia Procedure Notes (Signed)
Procedure Name: LMA Insertion Date/Time: 11/07/2011 12:29 PM Performed by: Renella Cunas D Pre-anesthesia Checklist: Patient identified, Emergency Drugs available, Suction available and Patient being monitored Patient Re-evaluated:Patient Re-evaluated prior to inductionOxygen Delivery Method: Circle System Utilized Preoxygenation: Pre-oxygenation with 100% oxygen Intubation Type: IV induction Ventilation: Mask ventilation without difficulty LMA: LMA inserted LMA Size: 4.0 Number of attempts: 1 Airway Equipment and Method: bite block Placement Confirmation: positive ETCO2 Tube secured with: Tape Dental Injury: Teeth and Oropharynx as per pre-operative assessment

## 2011-11-07 NOTE — Anesthesia Preprocedure Evaluation (Addendum)
Anesthesia Evaluation  Patient identified by MRN, date of birth, ID band Patient awake    Reviewed: Allergy & Precautions, H&P , NPO status , Patient's Chart, lab work & pertinent test results  Airway Mallampati: II TM Distance: >3 FB Neck ROM: Full    Dental No notable dental hx.    Pulmonary neg pulmonary ROS,  breath sounds clear to auscultation  Pulmonary exam normal       Cardiovascular negative cardio ROS  Rhythm:Regular Rate:Normal     Neuro/Psych PSYCHIATRIC DISORDERS Depression Chronic back pain, peripheral neuropathy negative neurological ROS  negative psych ROS   GI/Hepatic Neg liver ROS, GERD-  Medicated and Controlled,  Endo/Other    Renal/GU negative Renal ROS  negative genitourinary   Musculoskeletal negative musculoskeletal ROS (+)   Abdominal   Peds  Hematology negative hematology ROS (+)   Anesthesia Other Findings   Reproductive/Obstetrics negative OB ROS                         Anesthesia Physical Anesthesia Plan  ASA: II  Anesthesia Plan: General   Post-op Pain Management:    Induction: Intravenous  Airway Management Planned: LMA  Additional Equipment:   Intra-op Plan:   Post-operative Plan: Extubation in OR  Informed Consent: I have reviewed the patients History and Physical, chart, labs and discussed the procedure including the risks, benefits and alternatives for the proposed anesthesia with the patient or authorized representative who has indicated his/her understanding and acceptance.   Dental advisory given  Plan Discussed with: CRNA  Anesthesia Plan Comments:         Anesthesia Quick Evaluation

## 2011-11-07 NOTE — Anesthesia Postprocedure Evaluation (Signed)
  Anesthesia Post-op Note  Patient: Tamara Terry  Procedure(s) Performed: Procedure(s) (LRB): CYSTOSCOPY/HYDRODISTENSION (N/A)  Patient Location: PACU  Anesthesia Type: General  Level of Consciousness: awake and alert   Airway and Oxygen Therapy: Patient Spontanous Breathing  Post-op Pain: mild  Post-op Assessment: Post-op Vital signs reviewed, Patient's Cardiovascular Status Stable, Respiratory Function Stable, Patent Airway and No signs of Nausea or vomiting  Post-op Vital Signs: stable  Complications: No apparent anesthesia complications

## 2011-11-07 NOTE — Discharge Instructions (Signed)
CYSTOSCOPY HOME CARE INSTRUCTIONS ° °Activity: °Rest for the remainder of the day.  Do not drive or operate equipment today.  You may resume normal activities in one to two days as instructed by your physician.  ° °Meals: °Drink plenty of liquids and eat light foods such as gelatin or soup this evening.  You may return to a normal meal plan tomorrow. ° °Return to Work: °You may return to work in one to two days or as instructed by your physician. ° °Special Instructions / Symptoms: °Call your physician if any of these symptoms occur: ° ° -persistent or heavy bleeding ° -bleeding which continues after first few urination ° -large blood clots that are difficult to pass ° -urine stream diminishes or stops completely ° -fever equal to or higher than 101 degrees Farenheit. ° -cloudy urine with a strong, foul odor ° -severe pain ° °Females should always wipe from front to back after elimination.  You may feel some burning pain when you urinate.  This should disappear with time.  Applying moist heat to the lower abdomen or a hot tub bath may help relieve the pain. \ ° °Follow-Up / Date of Return Visit to Your Physician:  *** °Call for an appointment to arrange follow-up. ° °Patient Signature:  ________________________________________________________ ° °Nurse's Signature:  ________________________________________________________ ° ° °Post Anesthesia Home Care Instructions ° °Activity: °Get plenty of rest for the remainder of the day. A responsible adult should stay with you for 24 hours following the procedure.  °For the next 24 hours, DO NOT: °-Drive a car °-Operate machinery °-Drink alcoholic beverages °-Take any medication unless instructed by your physician °-Make any legal decisions or sign important papers. ° °Meals: °Start with liquid foods such as gelatin or soup. Progress to regular foods as tolerated. Avoid greasy, spicy, heavy foods. If nausea and/or vomiting occur, drink only clear liquids until the nausea  and/or vomiting subsides. Call your physician if vomiting continues. ° °Special Instructions/Symptoms: °Your throat may feel dry or sore from the anesthesia or the breathing tube placed in your throat during surgery. If this causes discomfort, gargle with warm salt water. The discomfort should disappear within 24 hours. ° °

## 2011-11-12 ENCOUNTER — Encounter (HOSPITAL_BASED_OUTPATIENT_CLINIC_OR_DEPARTMENT_OTHER): Payer: Self-pay | Admitting: Urology

## 2011-12-18 ENCOUNTER — Ambulatory Visit (INDEPENDENT_AMBULATORY_CARE_PROVIDER_SITE_OTHER): Payer: Managed Care, Other (non HMO) | Admitting: Urology

## 2011-12-18 DIAGNOSIS — N949 Unspecified condition associated with female genital organs and menstrual cycle: Secondary | ICD-10-CM

## 2012-06-30 ENCOUNTER — Other Ambulatory Visit: Payer: Self-pay | Admitting: Obstetrics & Gynecology

## 2012-06-30 DIAGNOSIS — Z139 Encounter for screening, unspecified: Secondary | ICD-10-CM

## 2012-07-08 ENCOUNTER — Ambulatory Visit (HOSPITAL_COMMUNITY)
Admission: RE | Admit: 2012-07-08 | Discharge: 2012-07-08 | Disposition: A | Discharge status: Home / Self Care | Payer: Managed Care, Other (non HMO) | Source: Ambulatory Visit | Attending: Obstetrics & Gynecology | Admitting: Obstetrics & Gynecology

## 2012-07-08 DIAGNOSIS — Z139 Encounter for screening, unspecified: Secondary | ICD-10-CM

## 2012-07-08 DIAGNOSIS — Z1231 Encounter for screening mammogram for malignant neoplasm of breast: Secondary | ICD-10-CM | POA: Insufficient documentation

## 2012-07-20 IMAGING — CT CT CERVICAL SPINE W/O CM
3 of 4 series · 9 of 33 positions shown, 10 images · non-contrast
Comparison: None.

CLINICAL DATA: Left-sided neck pain that radiates to the left arm.

CT CERVICAL SPINE WITHOUT CONTRAST
TECHNIQUE: Multidetector CT imaging of the cervical spine was
performed. Multiplanar CT image reconstructions were also
generated.

[Series 3: cervical 2.0 st axial · axial · 0.23mm/px · z∈[+80,+80]mm · 1 of 92 slices shown, 2 images]
[im 46/92  soft-tissue]
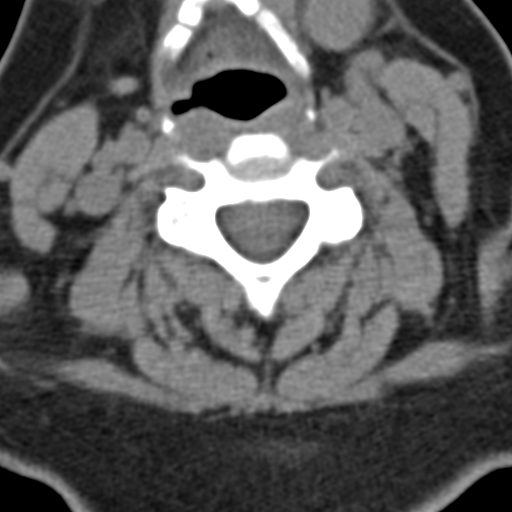
[im 46/92  bone]
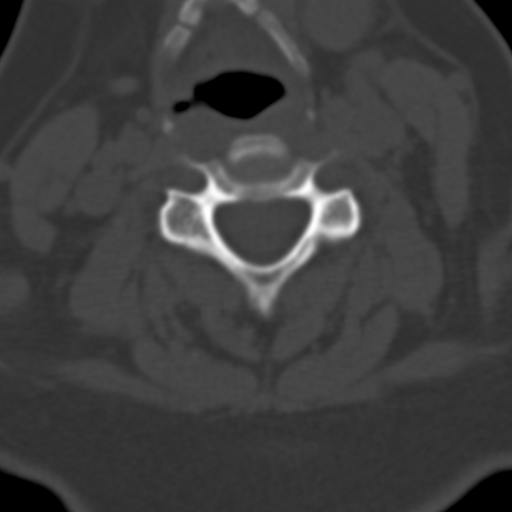

[Series 5: cervical spine sagittal bone · sagittal · 0.14mm/px · 5 of 37 slices shown]
[im 13/37  bone]
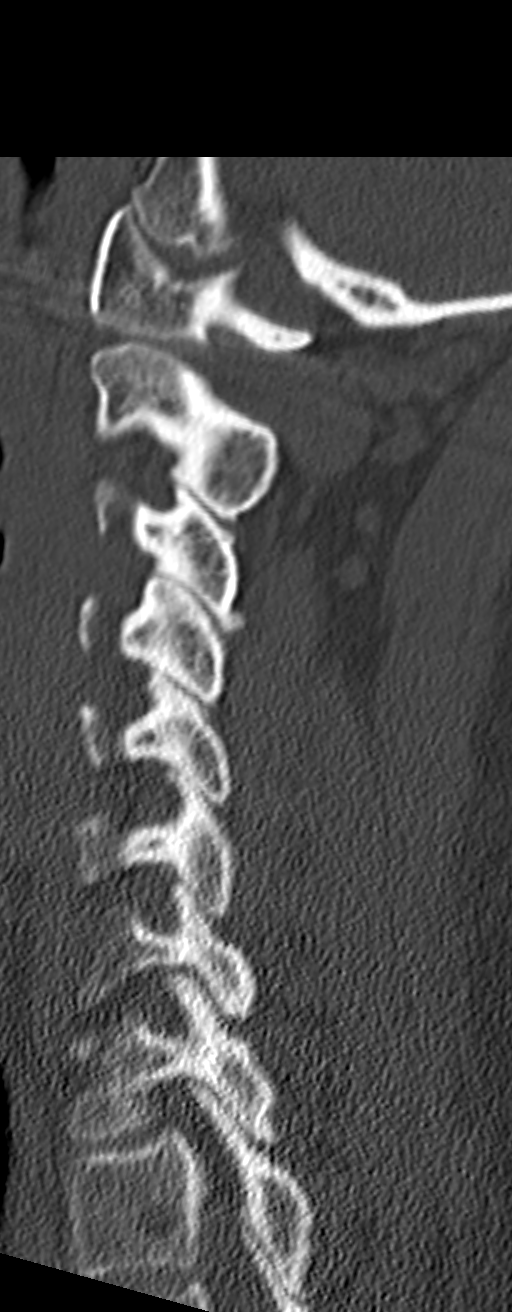
[im 16/37  bone]
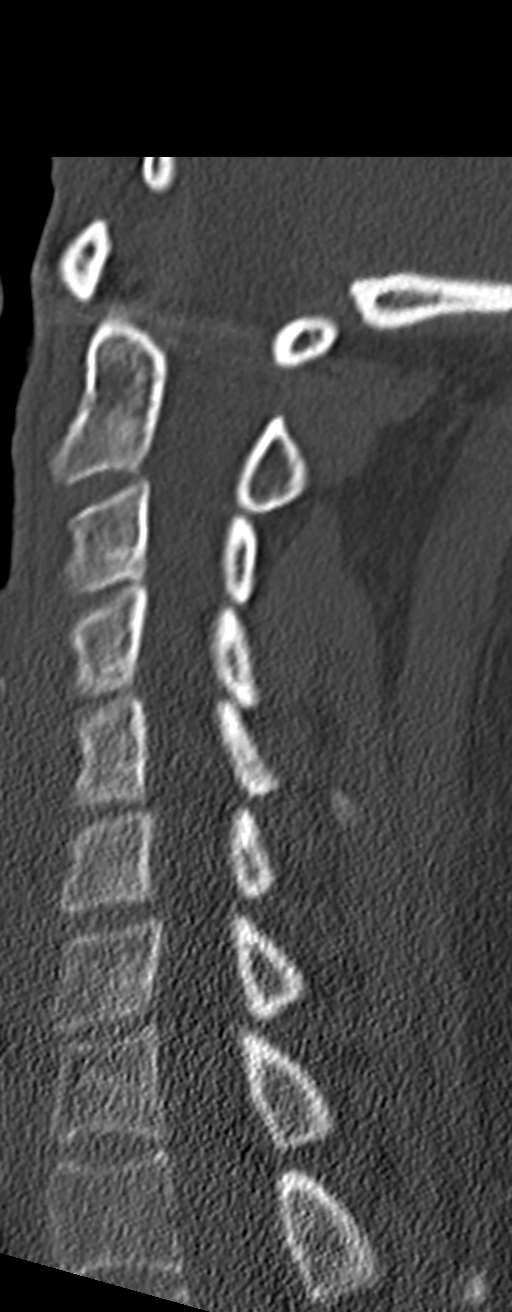
[im 19/37  bone]
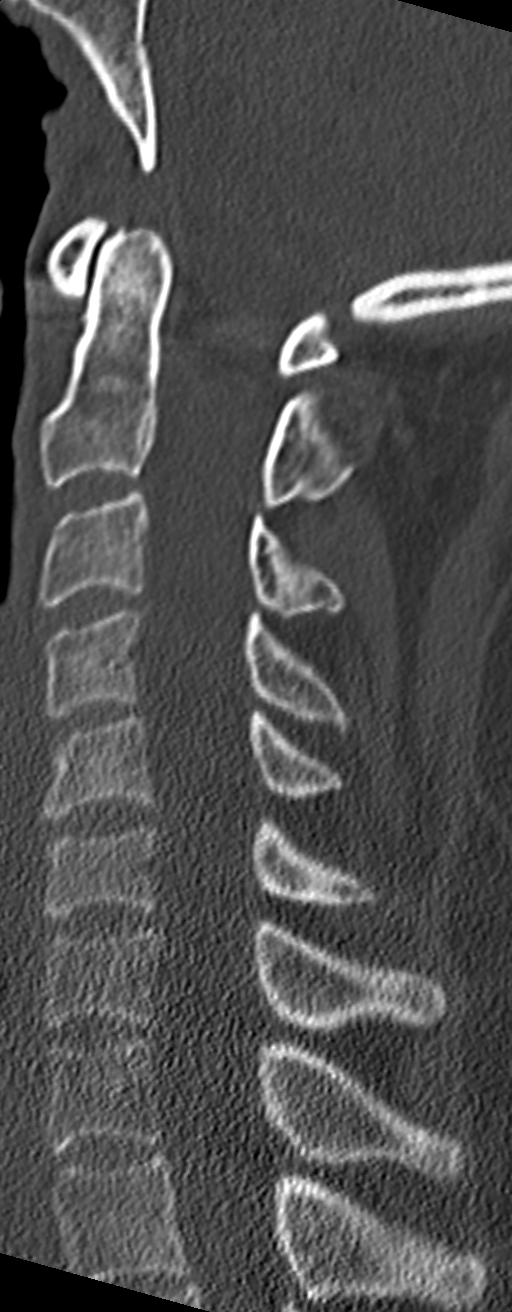
[im 22/37  bone]
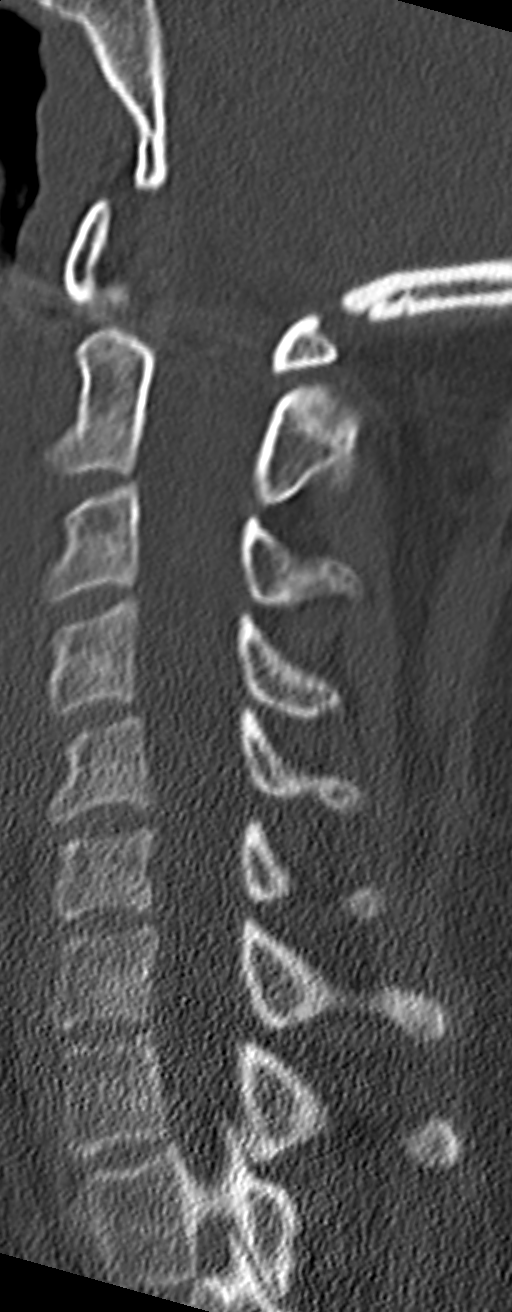
[im 25/37  bone]
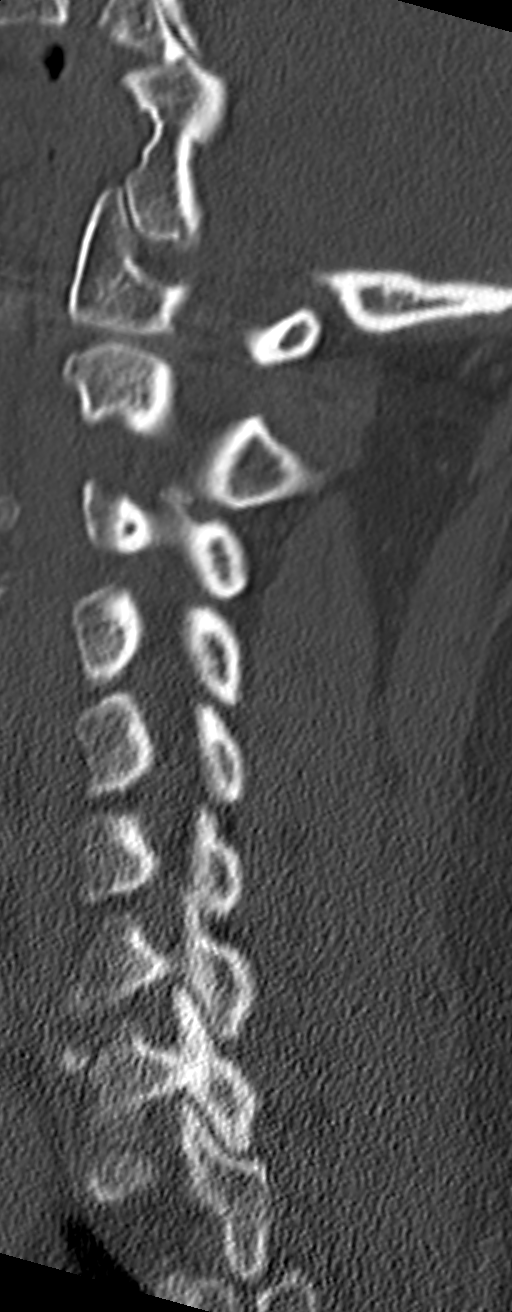

[Series 6: cervical spine coronal bone · coronal · 0.18mm/px · 3 of 37 slices shown]
[im 8/37  bone]
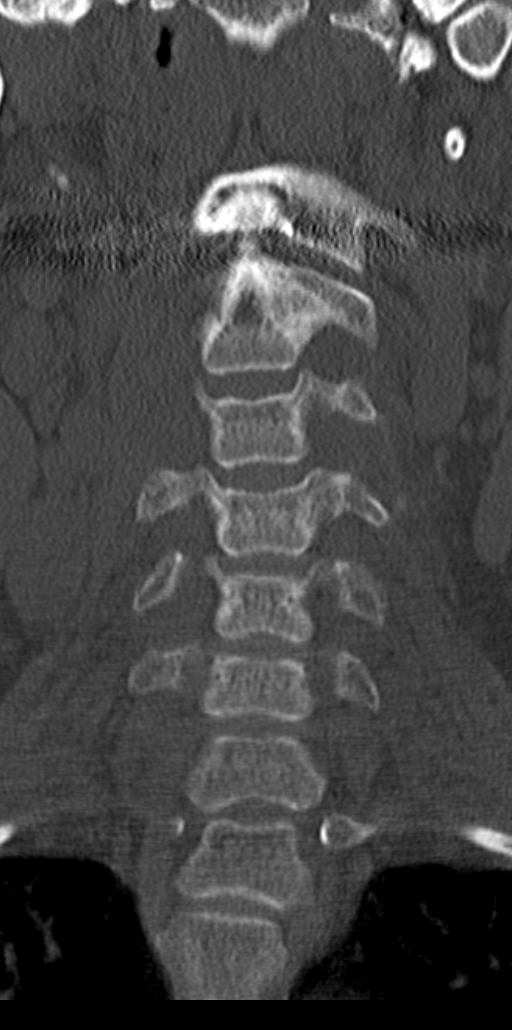
[im 15/37  bone]
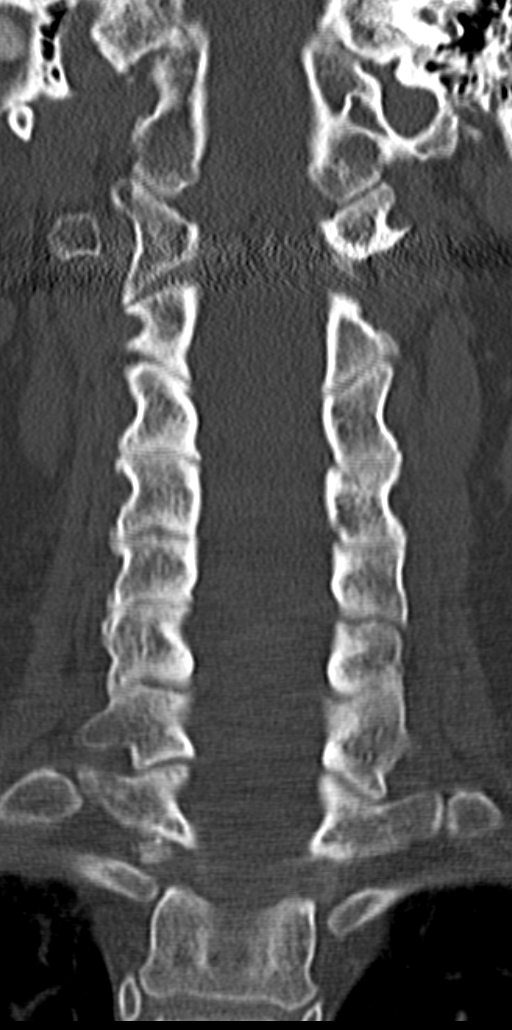
[im 22/37  bone]
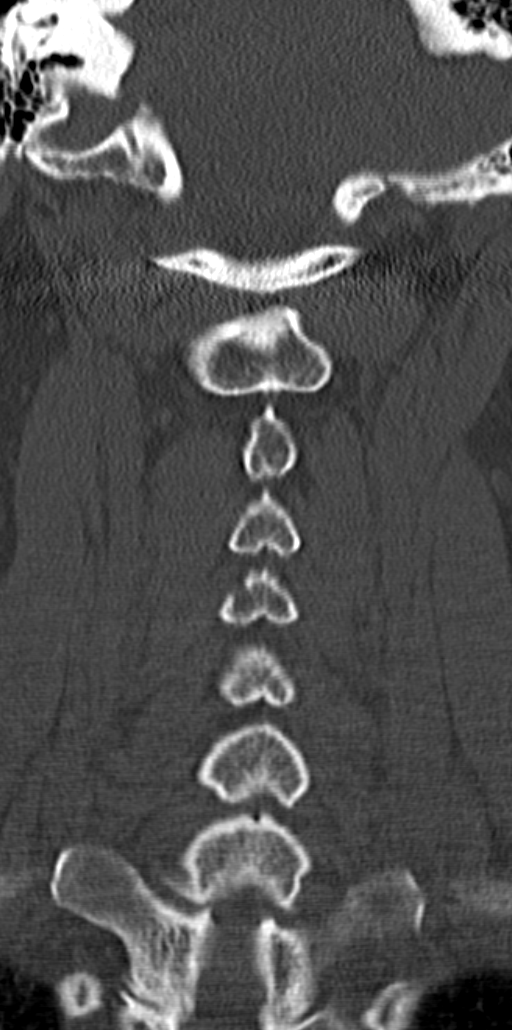

[9 of 33 positions shown; findings below may reference images not displayed]

FINDINGS: The lung apices are clear.  No evidence for acute
fracture or dislocation.  Visualized mastoid air cells are clear.
Normal alignment of the cervical spine including the
cervicothoracic junction.  Mild degenerative facet changes in the
upper cervical spine.  No gross abnormality in the posterior fossa
of the brain.  No gross soft tissue abnormalities in the neck.
There is no significant bony foraminal narrowing.
IMPRESSION: Mild degenerative changes in the cervical spine.  No acute bony
abnormality.

## 2012-11-10 ENCOUNTER — Other Ambulatory Visit (HOSPITAL_COMMUNITY): Payer: Self-pay | Admitting: Obstetrics & Gynecology

## 2013-01-13 ENCOUNTER — Other Ambulatory Visit (HOSPITAL_COMMUNITY): Payer: Self-pay | Admitting: Obstetrics & Gynecology

## 2013-09-28 ENCOUNTER — Other Ambulatory Visit: Payer: Self-pay | Admitting: Adult Health

## 2013-10-01 ENCOUNTER — Other Ambulatory Visit: Payer: Self-pay | Admitting: Obstetrics & Gynecology

## 2013-12-16 ENCOUNTER — Other Ambulatory Visit: Payer: Self-pay | Admitting: Obstetrics & Gynecology

## 2014-03-29 ENCOUNTER — Telehealth: Payer: Self-pay | Admitting: Adult Health

## 2014-03-30 NOTE — Telephone Encounter (Signed)
Left message I called 

## 2014-04-12 ENCOUNTER — Ambulatory Visit (INDEPENDENT_AMBULATORY_CARE_PROVIDER_SITE_OTHER): Payer: Managed Care, Other (non HMO) | Admitting: Adult Health

## 2014-04-12 ENCOUNTER — Encounter: Payer: Self-pay | Admitting: Adult Health

## 2014-04-12 ENCOUNTER — Other Ambulatory Visit (HOSPITAL_COMMUNITY)
Admission: RE | Admit: 2014-04-12 | Discharge: 2014-04-12 | Disposition: A | Discharge status: Home / Self Care | Payer: Managed Care, Other (non HMO) | Source: Ambulatory Visit | Attending: Adult Health | Admitting: Adult Health

## 2014-04-12 VITALS — BP 110/62 | HR 76 | Ht 64.5 in | Wt 229.0 lb

## 2014-04-12 DIAGNOSIS — Z01419 Encounter for gynecological examination (general) (routine) without abnormal findings: Secondary | ICD-10-CM | POA: Insufficient documentation

## 2014-04-12 DIAGNOSIS — E669 Obesity, unspecified: Secondary | ICD-10-CM | POA: Insufficient documentation

## 2014-04-12 DIAGNOSIS — Z1151 Encounter for screening for human papillomavirus (HPV): Secondary | ICD-10-CM | POA: Insufficient documentation

## 2014-04-12 DIAGNOSIS — Z1212 Encounter for screening for malignant neoplasm of rectum: Secondary | ICD-10-CM

## 2014-04-12 LAB — HEMOCCULT GUIAC POC 1CARD (OFFICE): Fecal Occult Blood, POC: NEGATIVE

## 2014-04-12 NOTE — Progress Notes (Signed)
Patient ID: Tamara Terry, female   DOB: 12/21/1960, 53 y.o.   MRN: [id number] History of Present Illness: Tamara Terry is a 53 year old white female, married in for a pap and physical.She wants to lose weight.   Current Medications, Allergies, Past Medical History, Past Surgical History, Family History and Social History were reviewed in [facility] electronic medical record.     Review of Systems: Patient denies any headaches, blurred vision, shortness of breath, chest pain, abdominal pain, problems with bowel movements, urination, or intercourse.  No joint swelling or mood changes.Had urethra stretched and has done well since.   Physical Exam:BP 110/62  Pulse 76  Ht 5' 4.5" (1.638 m)  Wt 229 lb (103.874 kg)  BMI 38.71 kg/m2 General:  Well developed, well nourished, no acute distress Skin:  Warm and dry Neck:  Midline trachea, normal thyroid Lungs; Clear to auscultation bilaterally Breast:  No dominant palpable mass, retraction, or nipple discharge Cardiovascular: Regular rate and rhythm Abdomen:  Soft, non tender, no hepatosplenomegaly,obese Pelvic:  External genitalia is normal in appearance.  The vagina has decrease color,moisture and rugae.The cervix is bulbous and smooth, pap performed with HPV.  Uterus is felt to be normal size, shape, and contour.  No  adnexal masses or tenderness noted. Rectal: Good sphincter tone, no polyps, or hemorrhoids felt.  Hemoccult negative. Extremities:  No swelling or varicosities noted Psych:  No mood changes,alert and cooperative,seems happy Discussed trying weight watchers or Atkins and is interested in diet aides, gave handout on contrave and told to google adipex, will check labs and talk again in 1-2 days.   Impression: Well woman yearly gyn exam with pap Obesity    Plan: Physical in 1 year Pap in 3 if normal with negative HPV Mammogram now and yearly Colonoscopy per GI Check CBC,CMP,TSH and lipids Review handout on contrave

## 2014-04-12 NOTE — Patient Instructions (Signed)
Physical in 1 year Mammogram now and yearly Colonoscopy per GI  

## 2014-04-13 ENCOUNTER — Telehealth: Payer: Self-pay | Admitting: Adult Health

## 2014-04-13 LAB — COMPREHENSIVE METABOLIC PANEL
ALBUMIN: 4.5 g/dL (ref 3.5–5.2)
ALK PHOS: 81 U/L (ref 39–117)
ALT: 26 U/L (ref 0–35)
AST: 21 U/L (ref 0–37)
BUN: 17 mg/dL (ref 6–23)
CO2: 29 mEq/L (ref 19–32)
Calcium: 10.4 mg/dL (ref 8.4–10.5)
Chloride: 104 mEq/L (ref 96–112)
Creat: 0.6 mg/dL (ref 0.50–1.10)
Glucose, Bld: 93 mg/dL (ref 70–99)
POTASSIUM: 3.8 meq/L (ref 3.5–5.3)
Sodium: 141 mEq/L (ref 135–145)
Total Bilirubin: 0.4 mg/dL (ref 0.2–1.2)
Total Protein: 6.6 g/dL (ref 6.0–8.3)

## 2014-04-13 LAB — LIPID PANEL
Cholesterol: 211 mg/dL — ABNORMAL HIGH (ref 0–200)
HDL: 40 mg/dL (ref >39)
LDL CALC: 109 mg/dL — AB (ref 0–99)
TRIGLYCERIDES: 309 mg/dL — AB (ref <150)
Total CHOL/HDL Ratio: 5.3 Ratio
VLDL: 62 mg/dL — AB (ref 0–40)

## 2014-04-13 LAB — CBC
HEMATOCRIT: 37.4 % (ref 36.0–46.0)
Hemoglobin: 12.8 g/dL (ref 12.0–15.0)
MCH: 31.4 pg (ref 26.0–34.0)
MCHC: 34.2 g/dL (ref 30.0–36.0)
MCV: 91.7 fL (ref 78.0–100.0)
Platelets: 295 10*3/uL (ref 150–400)
RBC: 4.08 MIL/uL (ref 3.87–5.11)
RDW: 12.4 % (ref 11.5–15.5)
WBC: 10.6 10*3/uL — ABNORMAL HIGH (ref 4.0–10.5)

## 2014-04-13 LAB — TSH: TSH: 1.522 u[IU]/mL (ref 0.350–4.500)

## 2014-04-13 MED ORDER — NALTREXONE-BUPROPION HCL ER 8-90 MG PO TB12: ORAL_TABLET | ORAL | Status: DC

## 2014-04-13 NOTE — Telephone Encounter (Signed)
Pt aware oflabs will recheck fasting lipids in 3 months wants to try contrave after reading info.

## 2014-04-14 ENCOUNTER — Other Ambulatory Visit: Payer: Self-pay | Admitting: Adult Health

## 2014-04-14 LAB — CYTOLOGY - PAP

## 2014-04-14 MED ORDER — NALTREXONE-BUPROPION HCL ER 8-90 MG PO TB12: ORAL_TABLET | ORAL | Status: DC

## 2014-04-26 ENCOUNTER — Encounter: Payer: Self-pay | Admitting: Adult Health

## 2014-10-17 ENCOUNTER — Other Ambulatory Visit: Payer: Self-pay | Admitting: Obstetrics & Gynecology

## 2014-12-29 ENCOUNTER — Other Ambulatory Visit: Payer: Self-pay | Admitting: Adult Health

## 2014-12-29 DIAGNOSIS — Z1231 Encounter for screening mammogram for malignant neoplasm of breast: Secondary | ICD-10-CM

## 2015-01-03 ENCOUNTER — Other Ambulatory Visit: Payer: Self-pay | Admitting: Obstetrics & Gynecology

## 2015-01-13 ENCOUNTER — Ambulatory Visit (HOSPITAL_COMMUNITY)
Admission: RE | Admit: 2015-01-13 | Discharge: 2015-01-13 | Disposition: A | Discharge status: Home / Self Care | Payer: Managed Care, Other (non HMO) | Source: Ambulatory Visit | Attending: Adult Health | Admitting: Adult Health

## 2015-01-13 DIAGNOSIS — Z1231 Encounter for screening mammogram for malignant neoplasm of breast: Secondary | ICD-10-CM | POA: Insufficient documentation

## 2015-01-13 LAB — HM MAMMOGRAPHY

## 2015-01-18 ENCOUNTER — Encounter: Payer: Self-pay | Admitting: *Deleted

## 2015-05-30 ENCOUNTER — Telehealth: Payer: Self-pay | Admitting: Adult Health

## 2015-05-30 NOTE — Telephone Encounter (Signed)
Called about daughter needs appt will call when daughter in town and make appt

## 2015-07-05 ENCOUNTER — Ambulatory Visit (INDEPENDENT_AMBULATORY_CARE_PROVIDER_SITE_OTHER): Payer: Managed Care, Other (non HMO) | Admitting: Adult Health

## 2015-07-05 ENCOUNTER — Encounter: Payer: Self-pay | Admitting: Adult Health

## 2015-07-05 VITALS — BP 110/70 | HR 74 | Ht 64.0 in | Wt 201.0 lb

## 2015-07-05 DIAGNOSIS — Z01419 Encounter for gynecological examination (general) (routine) without abnormal findings: Secondary | ICD-10-CM | POA: Diagnosis not present

## 2015-07-05 DIAGNOSIS — E669 Obesity, unspecified: Secondary | ICD-10-CM

## 2015-07-05 DIAGNOSIS — Z1212 Encounter for screening for malignant neoplasm of rectum: Secondary | ICD-10-CM

## 2015-07-05 LAB — HEMOCCULT GUIAC POC 1CARD (OFFICE): FECAL OCCULT BLD: NEGATIVE

## 2015-07-05 NOTE — Progress Notes (Signed)
Patient ID: Tamara Terry, female   DOB: 12/29/1960, 55 y.o.   MRN: [id number] History of Present Illness: Tamara LundJanet is a 55 year old white female, married in for a well woman gyn exam,she had a normal pap with negative HPV 04/12/14. PCP is [facility].  Current Medications, Allergies, Past Medical History, Past Surgical History, Family History and Social History were reviewed in [facility] electronic medical record.     Review of Systems:  Patient denies any headaches, hearing loss, fatigue, blurred vision, shortness of breath, chest pain, abdominal pain, problems with bowel movements, urination, or intercourse(not having sex). No joint pain or mood swings.She has lost 20 lbs since September.She has stress at home and is going to counseling.   Physical Exam:BP 110/70 mmHg  Pulse 74  Ht 5\' 4"  (1.626 m)  Wt 201 lb (91.173 kg)  BMI 34.48 kg/m2 General:  Well developed, well nourished, no acute distress Skin:  Warm and dry Neck:  Midline trachea, normal thyroid, good ROM, no lymphadenopathy Lungs; Clear to auscultation bilaterally Breast:  No dominant palpable mass, retraction, or nipple discharge Cardiovascular: Regular rate and rhythm Abdomen:  Soft, non tender, no hepatosplenomegaly Pelvic:  External genitalia is normal in appearance, no lesions.  The vagina is normal in appearance. Urethra has no lesions or masses. The cervix is bulbous.  Uterus is felt to be normal size, shape, and contour.  No adnexal masses or tenderness noted.Bladder is non tender, no masses felt. Rectal: Good sphincter tone, no polyps, or hemorrhoids felt.  Hemoccult negative. Extremities/musculoskeletal:  No swelling or varicosities noted, no clubbing or cyanosis Psych:  No mood changes, alert and cooperative,seems happy   Impression: Well woman gyn exam no pap Obesity    Plan: Labs with PCP Pap and physical in 1 year Mammogram yearly Colonoscopy per GI

## 2015-07-05 NOTE — Patient Instructions (Addendum)
Pap and physical in 1 year Mammogram yearly Labs with PCP 

## 2015-10-29 ENCOUNTER — Other Ambulatory Visit: Payer: Self-pay | Admitting: Obstetrics & Gynecology

## 2016-01-28 ENCOUNTER — Other Ambulatory Visit: Payer: Self-pay | Admitting: Obstetrics & Gynecology

## 2016-06-15 ENCOUNTER — Ambulatory Visit: Payer: Managed Care, Other (non HMO) | Admitting: Obstetrics & Gynecology

## 2016-07-03 ENCOUNTER — Ambulatory Visit (INDEPENDENT_AMBULATORY_CARE_PROVIDER_SITE_OTHER): Payer: Managed Care, Other (non HMO) | Admitting: Urology

## 2016-07-03 DIAGNOSIS — N3010 Interstitial cystitis (chronic) without hematuria: Secondary | ICD-10-CM | POA: Diagnosis not present

## 2016-07-03 DIAGNOSIS — R102 Pelvic and perineal pain: Secondary | ICD-10-CM

## 2016-07-24 ENCOUNTER — Ambulatory Visit (INDEPENDENT_AMBULATORY_CARE_PROVIDER_SITE_OTHER): Payer: Managed Care, Other (non HMO) | Admitting: Urology

## 2016-07-24 DIAGNOSIS — N3010 Interstitial cystitis (chronic) without hematuria: Secondary | ICD-10-CM

## 2016-07-24 DIAGNOSIS — R102 Pelvic and perineal pain: Secondary | ICD-10-CM

## 2016-11-05 ENCOUNTER — Other Ambulatory Visit: Payer: Self-pay | Admitting: Obstetrics & Gynecology

## 2017-02-21 ENCOUNTER — Other Ambulatory Visit: Payer: Self-pay | Admitting: Obstetrics & Gynecology

## 2017-02-22 ENCOUNTER — Other Ambulatory Visit: Payer: Self-pay | Admitting: *Deleted

## 2017-02-22 NOTE — Telephone Encounter (Signed)
erro  neous encounter

## 2017-02-26 ENCOUNTER — Other Ambulatory Visit: Payer: Self-pay | Admitting: *Deleted

## 2017-02-27 MED ORDER — OMEPRAZOLE 20 MG PO CPDR: 20.0000 mg | DELAYED_RELEASE_CAPSULE | Freq: Two times a day (BID) | ORAL | 4 refills | Status: DC

## 2017-06-04 ENCOUNTER — Other Ambulatory Visit: Payer: Self-pay | Admitting: Obstetrics & Gynecology

## 2017-07-25 ENCOUNTER — Telehealth: Payer: Self-pay | Admitting: *Deleted

## 2017-07-25 NOTE — Telephone Encounter (Signed)
Notified pharmacy drug is covered by benefit plan. No PA needed.

## 2018-01-02 ENCOUNTER — Other Ambulatory Visit: Payer: Self-pay | Admitting: Obstetrics & Gynecology

## 2018-03-31 ENCOUNTER — Other Ambulatory Visit: Payer: Self-pay | Admitting: Obstetrics & Gynecology

## 2018-06-11 ENCOUNTER — Ambulatory Visit (INDEPENDENT_AMBULATORY_CARE_PROVIDER_SITE_OTHER): Payer: Managed Care, Other (non HMO)

## 2018-06-11 ENCOUNTER — Ambulatory Visit: Payer: Managed Care, Other (non HMO) | Admitting: Orthopaedic Surgery

## 2018-06-11 ENCOUNTER — Encounter: Payer: Self-pay | Admitting: Orthopaedic Surgery

## 2018-06-11 VITALS — BP 118/77 | HR 61 | Ht 65.0 in | Wt 217.0 lb

## 2018-06-11 DIAGNOSIS — M542 Cervicalgia: Secondary | ICD-10-CM

## 2018-06-11 DIAGNOSIS — G5601 Carpal tunnel syndrome, right upper limb: Secondary | ICD-10-CM | POA: Diagnosis not present

## 2018-06-11 DIAGNOSIS — M25511 Pain in right shoulder: Secondary | ICD-10-CM | POA: Diagnosis not present

## 2018-06-11 NOTE — Patient Instructions (Signed)
An order has been sent to [facility]  [address] [place], [place]  [address].  Phone number [phone] requesting a nerve conduction study for you.  They will call you with an appointment.  If you have not been given an appointment in a reasonable amount of time please call them and ask to be scheduled. 

## 2018-06-11 NOTE — Progress Notes (Signed)
Subjective:    Patient ID: Tamara Terry, female    DOB: February 02, 1961, 57 y.o.   MRN: [id number]  HPI She has several month history of neck pain and pain running to the right shoulder and down the arm at times.  She has no trauma.  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  She has been on diclofenac 75 bid for over a year for a foot problem and also takes Toradol twice a day for pain.  She had some popping in the shoulder for a few days but that has gone away.  She has numbness in the hands, more on the right, mostly at night and it awakens her.  She has no redness, no joint swelling.     Review of Systems  Constitutional: Positive for activity change.  Musculoskeletal: Positive for arthralgias, back pain, gait problem and neck pain.  All other systems reviewed and are negative.  For Review of Systems, all other systems reviewed and are negative.  The following is a summary of the past history medically, past history surgically, known current medicines, social history and family history.  This information is gathered electronically by the computer from prior information and documentation.  I review this each visit and have found including this information at this point in the chart is beneficial and informative.   Past Medical History:  Diagnosis Date  . Chronic back pain   . Depression   . GERD (gastroesophageal reflux disease)   . Interstitial cystitis   . Neuropathy of both feet   . Obesity   . Plantar fasciitis, bilateral     Past Surgical History:  Procedure Laterality Date  . CHOLECYSTECTOMY  1990  . CYSTO WITH HYDRODISTENSION  11/07/2011   Procedure: CYSTOSCOPY/HYDRODISTENSION;  Surgeon: Marcine Matar, MD;  Location: [facility];  Service: Urology;  Laterality: N/A;  PYRIDIUM AND MARCAINE  . HYSTEROSCOPY W/D&C  07-21-2004    Current Outpatient Medications on File Prior to Visit  Medication Sig Dispense Refill  . cetirizine (ZYRTEC) 10 MG  tablet Take 10 mg by mouth daily.    . Fe Fum-FePoly-FA-Vit C-Vit B3 (FOLIVANE-F) 125-1 MG CAPS TAKE ONE (1) CAPSULE BY MOUTH TWICE A DAY. (EVERY 12 HOURS.) 60 capsule 6  . ibuprofen (ADVIL,MOTRIN) 200 MG tablet Take 400 mg by mouth every 6 (six) hours as needed. For pain    . omeprazole (PRILOSEC) 20 MG capsule Take 1 capsule (20 mg total) by mouth 2 (two) times daily. 180 capsule 4  . omeprazole (PRILOSEC) 20 MG capsule TAKE ONE CAPSULE BY MOUTH TWICE A DAY. 60 capsule 11  . phentermine 30 MG capsule Take 30 mg by mouth every morning.    . traMADol (ULTRAM) 50 MG tablet Take 50 mg by mouth every evening.     Current Facility-Administered Medications on File Prior to Visit  Medication Dose Route Frequency Provider Last Rate Last Dose  . bupivacaine (MARCAINE) 0.5 % 15 mL, phenazopyridine (PYRIDIUM) 400 mg bladder mixture   Bladder Instillation Once Marcine Matar, MD        Social History   Socioeconomic History  . Marital status: Married    Spouse name: Not on file  . Number of children: Not on file  . Years of education: Not on file  . Highest education level: Not on file  Occupational History  . Not on file  Social Needs  . Financial resource strain: Not on file  . Food insecurity:  Worry: Not on file    Inability: Not on file  . Transportation needs:    Medical: Not on file    Non-medical: Not on file  Tobacco Use  . Smoking status: Former Smoker    Types: Cigarettes    Last attempt to quit: 06/25/1994    Years since quitting: 23.9  . Smokeless tobacco: Never Used  Substance and Sexual Activity  . Alcohol use: No  . Drug use: No  . Sexual activity: Not Currently    Birth control/protection: Post-menopausal  Lifestyle  . Physical activity:    Days per week: Not on file    Minutes per session: Not on file  . Stress: Not on file  Relationships  . Social connections:    Talks on phone: Not on file    Gets together: Not on file    Attends religious service: Not  on file    Active member of club or organization: Not on file    Attends meetings of clubs or organizations: Not on file    Relationship status: Not on file  . Intimate partner violence:    Fear of current or ex partner: Not on file    Emotionally abused: Not on file    Physically abused: Not on file    Forced sexual activity: Not on file  Other Topics Concern  . Not on file  Social History Narrative  . Not on file    Family History  Problem Relation Age of Onset  . Heart disease Mother   . Diabetes Mother   . Hypertension Mother   . Parkinson's disease Mother   . Kidney disease Mother   . Colon polyps Mother   . Heart attack Father   . Diabetes Sister   . Diabetes Maternal Grandmother   . Cancer Maternal Grandfather     BP 118/77   Pulse 61   Ht 5\' 5"  (1.651 m)   Wt 217 lb (98.4 kg)   BMI 36.11 kg/m   Body mass index is 36.11 kg/m.     Objective:   Physical Exam Constitutional:      Appearance: She is well-developed.  HENT:     Head: Normocephalic and atraumatic.  Eyes:     Conjunctiva/sclera: Conjunctivae normal.     Pupils: Pupils are equal, round, and reactive to light.  Neck:     Musculoskeletal: Normal range of motion and neck supple.  Cardiovascular:     Rate and Rhythm: Normal rate and regular rhythm.  Pulmonary:     Effort: Pulmonary effort is normal.  Abdominal:     Palpations: Abdomen is soft.  Musculoskeletal:       Arms:  Skin:    General: Skin is warm and dry.  Neurological:     Mental Status: She is alert and oriented to person, place, and time.     Cranial Nerves: No cranial nerve deficit.     Motor: No abnormal muscle tone.     Coordination: Coordination normal.     Deep Tendon Reflexes: Reflexes are normal and symmetric. Reflexes normal.  Psychiatric:        Behavior: Behavior normal.        Thought Content: Thought content normal.        Judgment: Judgment normal.     X-rays of the cervical spine and right shoulder were  done, reported separately.      Assessment & Plan:   Encounter Diagnoses  Name Primary?  . Cervicalgia Yes  .  Pain in joint of right shoulder   . Carpal tunnel syndrome on right    PROCEDURE NOTE:  The patient request injection, verbal consent was obtained.  The right shoulder was prepped appropriately after time out was performed.   Sterile technique was observed and injection of 1 cc of Depo-Medrol 40 mg with several cc's of plain xylocaine. Anesthesia was provided by ethyl chloride and a 20-gauge needle was used to inject the shoulder area. A posterior approach was used.  The injection was tolerated well.  A band aid dressing was applied.  The patient was advised to apply ice later today and tomorrow to the injection sight as needed.  I will get EMGs for carpal tunnel study.  She is to continue the diclofenac.  She may need MRI of the cervical spine.    Call if any problem.  Precautions discussed.   Return in two weeks.  Electronically Signed Darreld Mclean, MD 12/18/20199:30 AM

## 2018-07-28 ENCOUNTER — Other Ambulatory Visit: Payer: Self-pay | Admitting: Obstetrics & Gynecology

## 2018-09-29 ENCOUNTER — Telehealth: Payer: Self-pay | Admitting: Orthopaedic Surgery

## 2018-09-29 NOTE — Telephone Encounter (Signed)
Set up telehealth visit

## 2018-09-29 NOTE — Telephone Encounter (Signed)
Patient was seen back in December one time for shoulder.  She now has a new problem which is left knee pain.  She has not been to ED and not seen anyone else for this problem.  She said she had previously been taking anti-inflammatories given to her by her podiatrist but he has since retired.  She is not taking anything.  She said the left knee is slightly swollen, painful to bend and sometimes feels like it wants to give way.    She asked for an appointment but I told her that I would have to get with  Dr. Hilda Lias on whether to have her come in here or do a virtual visit or what.  I told her that I would call her back tomorrow.  Please advise.  Thanks

## 2018-09-30 ENCOUNTER — Ambulatory Visit (INDEPENDENT_AMBULATORY_CARE_PROVIDER_SITE_OTHER): Payer: PRIVATE HEALTH INSURANCE | Admitting: Orthopaedic Surgery

## 2018-09-30 ENCOUNTER — Encounter: Payer: Self-pay | Admitting: Orthopaedic Surgery

## 2018-09-30 ENCOUNTER — Other Ambulatory Visit: Payer: Self-pay

## 2018-09-30 DIAGNOSIS — M25562 Pain in left knee: Secondary | ICD-10-CM

## 2018-09-30 NOTE — Progress Notes (Signed)
**Note DeTamaraIdentified via Obfuscation** Virtual Visit via Video Note  I connected with Tamara Terry on 09/30/18 at 10:10 AM EDT by a video enabled telemedicine application and verified that I am speaking with the correct person using two identifiers.   I discussed the limitations of evaluation and management by telemedicine and the availability of in person appointments. The patient expressed understanding and agreed to proceed.  History of Present Illness: She hurt her left knee in a twisting motion at home about 10 days ago. She has had swelling and popping.  Just the other day she had an episode of buckling and giving way.  She has no other trauma, no redness, no other joint pains.  She has taken ibuprofen 600 mgm bid.  She has used ice.   Observations/Objective:  Per above  Assessment and Plan: Encounter Diagnosis  Name Primary?  . Acute pain of left knee Yes   I have recommended to increase ibuprofen to 600 mgm tid.  I have recommended ice.  I have recommended be careful on stairs.  I am concerned about meniscus tear.  MRI are not running now secondary to COVIDTamara19 problem.  Follow Up Instructions:   I will call her in two weeks to see how she is doing.  Call if any problem.  Precautions discussed.    I discussed the assessment and treatment plan with the patient. The patient was provided an opportunity to ask questions and all were answered. The patient agreed with the plan and demonstrated an understanding of the instructions.   The patient was advised to call back or seek an inTamaraperson evaluation if the symptoms worsen or if the condition fails to improve as anticipated.  I provided 8 minutes of nonTamarafaceTamaratoTamaraface time during this encounter.   Darreld Mclean, MD

## 2018-10-14 ENCOUNTER — Other Ambulatory Visit: Payer: Self-pay

## 2018-10-14 ENCOUNTER — Ambulatory Visit: Payer: PRIVATE HEALTH INSURANCE | Admitting: Orthopaedic Surgery

## 2019-03-03 ENCOUNTER — Other Ambulatory Visit: Payer: Self-pay | Admitting: Obstetrics & Gynecology

## 2019-04-27 ENCOUNTER — Other Ambulatory Visit: Payer: Self-pay | Admitting: Obstetrics & Gynecology

## 2019-09-30 ENCOUNTER — Other Ambulatory Visit: Payer: Self-pay | Admitting: Obstetrics & Gynecology

## 2019-11-10 ENCOUNTER — Ambulatory Visit: Payer: Managed Care, Other (non HMO) | Admitting: Orthopaedic Surgery

## 2019-11-10 ENCOUNTER — Encounter: Payer: Self-pay | Admitting: Orthopaedic Surgery

## 2019-11-10 ENCOUNTER — Ambulatory Visit: Payer: Managed Care, Other (non HMO)

## 2019-11-10 ENCOUNTER — Other Ambulatory Visit: Payer: Self-pay

## 2019-11-10 VITALS — BP 128/74 | HR 65 | Ht 65.0 in | Wt 210.0 lb

## 2019-11-10 DIAGNOSIS — G8929 Other chronic pain: Secondary | ICD-10-CM

## 2019-11-10 DIAGNOSIS — M25512 Pain in left shoulder: Secondary | ICD-10-CM

## 2019-11-10 NOTE — Progress Notes (Signed)
Patient HY:WVPXT Tamara Terry, female DOB:1961/04/07, 59 y.o. GGY:[id number]  Chief Complaint  Patient presents with  . Shoulder Pain    Upper arm for 2 months    HPI  Tamara Terry is a 59 y.o. female who has developed pain in the left shoulder over the last two to three months. She has no trauma, no weakness,no redness, no numbness.  She has pain with overhead use and rolling on it at night.  She has tried rest, heat, ice, Advil with no help.   Body mass index is 34.95 kg/m.  ROS  Review of Systems  Constitutional: Positive for activity change.  Musculoskeletal: Positive for arthralgias, back pain, gait problem and neck pain.  All other systems reviewed and are negative.   All other systems reviewed and are negative.  The following is a summary of the past history medically, past history surgically, known current medicines, social history and family history.  This information is gathered electronically by the computer from prior information and documentation.  I review this each visit and have found including this information at this point in the chart is beneficial and informative.    Past Medical History:  Diagnosis Date  . Chronic back pain   . Depression   . GERD (gastroesophageal reflux disease)   . Interstitial cystitis   . Neuropathy of both feet   . Obesity   . Plantar fasciitis, bilateral     Past Surgical History:  Procedure Laterality Date  . CHOLECYSTECTOMY  1990  . CYSTO WITH HYDRODISTENSION  11/07/2011   Procedure: CYSTOSCOPY/HYDRODISTENSION;  Surgeon: Franchot Gallo, MD;  Location: [facility];  Service: Urology;  Laterality: N/A;  PYRIDIUM AND MARCAINE  . HYSTEROSCOPY WITH D & C  07-21-2004    Family History  Problem Relation Age of Onset  . Heart disease Mother   . Diabetes Mother   . Hypertension Mother   . Parkinson's disease Mother   . Kidney disease Mother   . Colon polyps Mother   . Heart attack Father   . Diabetes Sister    . Diabetes Maternal Grandmother   . Cancer Maternal Grandfather     Social History Social History   Tobacco Use  . Smoking status: Former Smoker    Types: Cigarettes    Quit date: 06/25/1994    Years since quitting: 25.3  . Smokeless tobacco: Never Used  Substance Use Topics  . Alcohol use: No  . Drug use: No    Allergies  Allergen Reactions  . Azithromycin   . Latex     Current Outpatient Medications  Medication Sig Dispense Refill  . cetirizine (ZYRTEC) 10 MG tablet Take 10 mg by mouth daily.    . Fe Fum-FePoly-FA-Vit C-Vit B3 (FOLIVANE-F) 125-1 MG CAPS TAKE 1 CAPSULE BY MOUTH TWICE A DAY (EVERY 12 HOURS) 60 capsule 6  . ibuprofen (ADVIL,MOTRIN) 200 MG tablet Take 400 mg by mouth every 6 (six) hours as needed. For pain    . omeprazole (PRILOSEC) 20 MG capsule Take 1 capsule (20 mg total) by mouth 2 (two) times daily. (Patient not taking: Reported on 11/10/2019) 180 capsule 4  . omeprazole (PRILOSEC) 20 MG capsule TAKE ONE (1) CAPSULE BY MOUTH TWICE A DAY. (EVERY 12 HOURS.) 60 capsule 11  . phentermine 30 MG capsule Take 30 mg by mouth every morning.    . sertraline (ZOLOFT) 50 MG tablet Take 50 mg by mouth daily.    . traMADol (ULTRAM) 50 MG tablet Take 50  mg by mouth every evening.     No current facility-administered medications for this visit.   Facility-Administered Medications Ordered in Other Visits  Medication Dose Route Frequency Provider Last Rate Last Admin  . bupivacaine (MARCAINE) 0.5 % 15 mL, phenazopyridine (PYRIDIUM) 400 mg bladder mixture   Bladder Instillation Once Marcine Matar, MD         Physical Exam  Blood pressure 128/74, pulse 65, height 5\' 5"  (1.651 m), weight 210 lb (95.3 kg).  Constitutional: overall normal hygiene, normal nutrition, well developed, normal grooming, normal body habitus. Assistive device:none  Musculoskeletal: gait and station Limp none, muscle tone and strength are normal, no tremors or atrophy is present.  .   Neurological: coordination overall normal.  Deep tendon reflex/nerve stretch intact.  Sensation normal.  Cranial nerves II-XII intact.   Skin:   Normal overall no scars, lesions, ulcers or rashes. No psoriasis.  Psychiatric: Alert and oriented x 3.  Recent memory intact, remote memory unclear.  Normal mood and affect. Well groomed.  Good eye contact.  Cardiovascular: overall no swelling, no varicosities, no edema bilaterally, normal temperatures of the legs and arms, no clubbing, cyanosis and good capillary refill.  Lymphatic: palpation is normal.  Examination of left Upper Extremity is done.  Inspection:   Overall:  Elbow non-tender without crepitus or defects, forearm non-tender without crepitus or defects, wrist non-tender without crepitus or defects, hand non-tender.    Shoulder: with glenohumeral joint tenderness, without effusion.   Upper arm:  without swelling and tenderness   Range of motion:   Overall:  Full range of motion of the elbow, full range of motion of wrist and full range of motion in fingers.   Shoulder:  left  150 degrees forward flexion; 130 degrees abduction; 25 degrees internal rotation, 25 degrees external rotation, 10 degrees extension, 40 degrees adduction.   Stability:   Overall:  Shoulder, elbow and wrist stable   Strength and Tone:   Overall full shoulder muscles strength, full upper arm strength and normal upper arm bulk and tone.  All other systems reviewed and are negative   The patient has been educated about the nature of the problem(s) and counseled on treatment options.  The patient appeared to understand what I have discussed and is in agreement with it.  Encounter Diagnosis  Name Primary?  . Chronic pain in left shoulder Yes    X-rays were done of the left shoulder, reported separately.    PROCEDURE NOTE:  The patient request injection, verbal consent was obtained.  The left shoulder was prepped appropriately after time out was  performed.   Sterile technique was observed and injection of 1 cc of Depo-Medrol 40 mg with several cc's of plain xylocaine. Anesthesia was provided by ethyl chloride and a 20-gauge needle was used to inject the shoulder area. A posterior approach was used.  The injection was tolerated well.  A band aid dressing was applied.  The patient was advised to apply ice later today and tomorrow to the injection sight as needed.   PLAN Call if any problems.  Precautions discussed.  Continue current medications.   Return to clinic 2 weeks   She may need MRI.  Take one Aleve bid pc.  Electronically Signed , MD 5/18/20213:24 PM

## 2019-11-24 ENCOUNTER — Ambulatory Visit: Payer: Managed Care, Other (non HMO) | Admitting: Orthopaedic Surgery

## 2019-11-24 ENCOUNTER — Ambulatory Visit: Payer: Managed Care, Other (non HMO)

## 2019-11-24 ENCOUNTER — Other Ambulatory Visit: Payer: Self-pay

## 2019-11-24 ENCOUNTER — Encounter: Payer: Self-pay | Admitting: Orthopaedic Surgery

## 2019-11-24 VITALS — BP 116/84 | HR 67 | Ht 65.0 in | Wt 214.0 lb

## 2019-11-24 DIAGNOSIS — M542 Cervicalgia: Secondary | ICD-10-CM

## 2019-11-24 DIAGNOSIS — G8929 Other chronic pain: Secondary | ICD-10-CM

## 2019-11-24 DIAGNOSIS — M25512 Pain in left shoulder: Secondary | ICD-10-CM

## 2019-11-24 MED ORDER — HYDROCODONE-ACETAMINOPHEN 5-325 MG PO TABS: ORAL_TABLET | ORAL | 0 refills | Status: DC

## 2019-11-24 NOTE — Progress Notes (Signed)
Patient [name], female DOB:12-23-1960, 59 y.o. NID:[id number]  Chief Complaint  Patient presents with  . Shoulder Pain    L/about the same/pain seems to be going down to fingers causing a tingling feeling    HPI  Tamara Terry is a 59 y.o. female who has pain of the left shoulder and now has some numbness of the fingers in the median nerve distribution.  She has pain that awakens her at night.  She has less shoulder pain and more motion of the shoulder.  She has no new trauma.   Body mass index is 35.61 kg/m.  ROS  Review of Systems  Constitutional: Positive for activity change.  Musculoskeletal: Positive for arthralgias, back pain, gait problem and neck pain.  All other systems reviewed and are negative.   All other systems reviewed and are negative.  The following is a summary of the past history medically, past history surgically, known current medicines, social history and family history.  This information is gathered electronically by the computer from prior information and documentation.  I review this each visit and have found including this information at this point in the chart is beneficial and informative.    Past Medical History:  Diagnosis Date  . Chronic back pain   . Depression   . GERD (gastroesophageal reflux disease)   . Interstitial cystitis   . Neuropathy of both feet   . Obesity   . Plantar fasciitis, bilateral     Past Surgical History:  Procedure Laterality Date  . CHOLECYSTECTOMY  1990  . CYSTO WITH HYDRODISTENSION  11/07/2011   Procedure: CYSTOSCOPY/HYDRODISTENSION;  Surgeon: Franchot Gallo, MD;  Location: [facility];  Service: Urology;  Laterality: N/A;  PYRIDIUM AND MARCAINE  . HYSTEROSCOPY WITH D & C  07-21-2004    Family History  Problem Relation Age of Onset  . Heart disease Mother   . Diabetes Mother   . Hypertension Mother   . Parkinson's disease Mother   . Kidney disease Mother   . Colon polyps Mother    . Heart attack Father   . Diabetes Sister   . Diabetes Maternal Grandmother   . Cancer Maternal Grandfather     Social History Social History   Tobacco Use  . Smoking status: Former Smoker    Types: Cigarettes    Quit date: 06/25/1994    Years since quitting: 25.4  . Smokeless tobacco: Never Used  Substance Use Topics  . Alcohol use: No  . Drug use: No    Allergies  Allergen Reactions  . Azithromycin   . Latex     Current Outpatient Medications  Medication Sig Dispense Refill  . cetirizine (ZYRTEC) 10 MG tablet Take 10 mg by mouth daily.    . Fe Fum-FePoly-FA-Vit C-Vit B3 (FOLIVANE-F) 125-1 MG CAPS TAKE 1 CAPSULE BY MOUTH TWICE A DAY (EVERY 12 HOURS) 60 capsule 6  . ibuprofen (ADVIL,MOTRIN) 200 MG tablet Take 400 mg by mouth every 6 (six) hours as needed. For pain    . omeprazole (PRILOSEC) 20 MG capsule TAKE ONE (1) CAPSULE BY MOUTH TWICE A DAY. (EVERY 12 HOURS.) 60 capsule 11  . sertraline (ZOLOFT) 50 MG tablet Take 50 mg by mouth daily.    Marland Kitchen omeprazole (PRILOSEC) 20 MG capsule Take 1 capsule (20 mg total) by mouth 2 (two) times daily. (Patient not taking: Reported on 11/10/2019) 180 capsule 4  . phentermine 30 MG capsule Take 30 mg by mouth every morning.    Marland Kitchen  traMADol (ULTRAM) 50 MG tablet Take 50 mg by mouth every evening.     No current facility-administered medications for this visit.   Facility-Administered Medications Ordered in Other Visits  Medication Dose Route Frequency Provider Last Rate Last Admin  . bupivacaine (MARCAINE) 0.5 % 15 mL, phenazopyridine (PYRIDIUM) 400 mg bladder mixture   Bladder Instillation Once Marcine Matar, MD         Physical Exam  Blood pressure 116/84, pulse 67, height 5\' 5"  (1.651 m), weight 214 lb (97.1 kg).  Constitutional: overall normal hygiene, normal nutrition, well developed, normal grooming, normal body habitus. Assistive device:none  Musculoskeletal: gait and station Limp none, muscle tone and strength are normal,  no tremors or atrophy is present.  .  Neurological: coordination overall normal.  Deep tendon reflex/nerve stretch intact.  Sensation normal.  Cranial nerves II-XII intact.   Skin:   Normal overall no scars, lesions, ulcers or rashes. No psoriasis.  Psychiatric: Alert and oriented x 3.  Recent memory intact, remote memory unclear.  Normal mood and affect. Well groomed.  Good eye contact.  Cardiovascular: overall no swelling, no varicosities, no edema bilaterally, normal temperatures of the legs and arms, no clubbing, cyanosis and good capillary refill.  Lymphatic: palpation is normal.  Left shoulder motion is full.  Left hand has some decreased sensation of median nerve distribution but negative Tinel and Phalen signs.    All other systems reviewed and are negative   The patient has been educated about the nature of the problem(s) and counseled on treatment options.  The patient appeared to understand what I have discussed and is in agreement with it.  Encounter Diagnoses  Name Primary?  . Neck pain on left side Yes  . Chronic pain in left shoulder    X-rays were done of the cervical spine, reported separately.  PLAN Call if any problems.  Precautions discussed.  Continue current medications.   Return to clinic 2 weeks   I will begin pain medicine.  I will schedule for EMG's.  I have reviewed the Controlled Substance Reporting System web site prior to prescribing narcotic medicine for this patient.   Electronically Signed [place], MD 6/1/20213:57 PM

## 2019-11-24 NOTE — Addendum Note (Signed)
Addended by: Jodene Nam A on: 11/24/2019 04:02 PM   Modules accepted: Orders

## 2019-12-15 ENCOUNTER — Ambulatory Visit: Payer: Managed Care, Other (non HMO) | Admitting: Orthopaedic Surgery

## 2020-02-02 ENCOUNTER — Other Ambulatory Visit: Payer: Self-pay | Admitting: Orthopaedic Surgery

## 2020-03-09 ENCOUNTER — Encounter (INDEPENDENT_AMBULATORY_CARE_PROVIDER_SITE_OTHER): Payer: Self-pay | Admitting: Ophthalmology

## 2020-03-14 NOTE — Progress Notes (Signed)
[facility] - Clinic Note  03/15/2020     CHIEF COMPLAINT Patient presents for Retina Evaluation   HISTORY OF PRESENT ILLNESS: Tamara Terry is a 59 y.o. female who presents to the clinic today for:   HPI    Retina Evaluation    In both eyes.  This started 6 weeks ago.  Duration of 6 weeks.  Associated Symptoms Flashes and Floaters.  Context:  distance vision, mid-range vision and near vision.  I, the attending physician,  performed the HPI with the patient and updated documentation appropriately.          Comments    Pt states she hit her head on a tank in a restaurant and began seeing flashes of light OU.  Patient has persistent floaters in her left eye that she did not have prior to head trauma and states she still has an occasional flash of light.  Patient denies eye pain.    Hx of lasik OU (Dr. Delaney Meigs >20 yrs ago)--patient does not remember what pre-op Rx was       Last edited by Rennis Chris, MD on 03/15/2020  9:13 AM. (History)    pt states she hit her head on a tank in the back of her restaurant and started seeing fol and floaters afterwards, she states the floaters she still sees all the time and the flash of light she only sees when she turns her head a certain way, she states she did not lose consciousness or fall when she hit her head  Referring physician: Michaelle Copas, MD [phone] [facility] [address] [place],  [place] [address]  HISTORICAL INFORMATION:   Selected notes from the MEDICAL RECORD NUMBER Referred by Dr. Conley Rolls for eval of FOL/floaters OS x 1 wk   CURRENT MEDICATIONS: No current outpatient medications on file. (Ophthalmic Drugs)   No current facility-administered medications for this visit. (Ophthalmic Drugs)   Current Outpatient Medications (Other)  Medication Sig  . cetirizine (ZYRTEC) 10 MG tablet Take 10 mg by mouth daily.  . Fe Fum-FePoly-FA-Vit C-Vit B3 (FOLIVANE-F) 125-1 MG CAPS TAKE 1 CAPSULE BY MOUTH TWICE A DAY (EVERY 12 HOURS)  .  HYDROcodone-acetaminophen (NORCO/VICODIN) 5-325 MG tablet TAKE ONE TABLET BY MOUTH EVERY FOUR HOURS  . ibuprofen (ADVIL,MOTRIN) 200 MG tablet Take 400 mg by mouth every 6 (six) hours as needed. For pain  . omeprazole (PRILOSEC) 20 MG capsule Take 1 capsule (20 mg total) by mouth 2 (two) times daily. (Patient not taking: Reported on 11/10/2019)  . omeprazole (PRILOSEC) 20 MG capsule TAKE ONE (1) CAPSULE BY MOUTH TWICE A DAY. (EVERY 12 HOURS.)  . phentermine 30 MG capsule Take 30 mg by mouth every morning.  . sertraline (ZOLOFT) 50 MG tablet Take 50 mg by mouth daily.  . traMADol (ULTRAM) 50 MG tablet Take 50 mg by mouth every evening.   No current facility-administered medications for this visit. (Other)   Facility-Administered Medications Ordered in Other Visits (Other)  Medication Route  . bupivacaine (MARCAINE) 0.5 % 15 mL, phenazopyridine (PYRIDIUM) 400 mg bladder mixture Bladder Instillation      REVIEW OF SYSTEMS: ROS    Negative for: Constitutional, Gastrointestinal, Neurological, Skin, Genitourinary, Musculoskeletal, HENT, Endocrine, Cardiovascular, Eyes, Respiratory, Psychiatric, Allergic/Imm, Heme/Lymph   Last edited by Corrinne Eagle on 03/15/2020  8:20 AM. (History)       ALLERGIES Allergies  Allergen Reactions  . Azithromycin   . Latex     PAST MEDICAL HISTORY Past Medical  History:  Diagnosis Date  . Chronic back pain   . Depression   . GERD (gastroesophageal reflux disease)   . Interstitial cystitis   . Neuropathy of both feet   . Obesity   . Plantar fasciitis, bilateral    Past Surgical History:  Procedure Laterality Date  . CHOLECYSTECTOMY  1990  . CYSTO WITH HYDRODISTENSION  11/07/2011   Procedure: CYSTOSCOPY/HYDRODISTENSION;  Surgeon: Marcine MatarStephen Dahlstedt, MD;  [facility];  Service: Urology;  Laterality: N/A;  PYRIDIUM AND MARCAINE  . HYSTEROSCOPY WITH D & C  07-21-2004    FAMILY HISTORY Family History  Problem Relation Age  of Onset  . Heart disease Mother   . Diabetes Mother   . Hypertension Mother   . Parkinson's disease Mother   . Kidney disease Mother   . Colon polyps Mother   . Heart attack Father   . Diabetes Sister   . Diabetes Maternal Grandmother   . Cancer Maternal Grandfather     SOCIAL HISTORY Social History   Tobacco Use  . Smoking status: Former Smoker    Types: Cigarettes    Quit date: 06/25/1994    Years since quitting: 25.7  . Smokeless tobacco: Never Used  Substance Use Topics  . Alcohol use: No  . Drug use: No         OPHTHALMIC EXAM:  Base Eye Exam    Visual Acuity (Snellen - Linear)      Right Left   Dist [place] 20/20 -1 20/20 -2       Tonometry (Tonopen, 8:27 AM)      Right Left   Pressure 17 17       Pupils      Dark Light Shape React APD   Right 4 3 Round Brisk 0   Left 4 3 Round Brisk 0       Visual Fields      Left Right    Full Full       Extraocular Movement      Right Left    Full Full       Neuro/Psych    Oriented x3: Yes   Mood/Affect: Normal       Dilation    Both eyes: 1.0% Mydriacyl, 2.5% Phenylephrine @ 8:27 AM        Slit Lamp and Fundus Exam    Slit Lamp Exam      Right Left   Lids/Lashes Dermatochalasis - upper lid, Meibomian gland dysfunction Dermatochalasis - upper lid, Meibomian gland dysfunction   Conjunctiva/Sclera White and quiet White and quiet   Cornea 1-2+ Punctate epithelial erosions, Debris in tear film, well healed lasik flap 1-2+ Punctate epithelial erosions, Debris in tear film, well healed lasik flap   Anterior Chamber Deep and quiet Deep and quiet   Iris Round and dilated Round and dilated   Lens 2+ Nuclear sclerosis, 2-3+ Cortical cataract 2+ Nuclear sclerosis, 2-3+ Cortical cataract   Vitreous Vitreous syneresis, no pigment, Posterior vitreous detachment, vitreous debris Vitreous syneresis, no pigment, Posterior vitreous detachment, vitreous condensations       Fundus Exam      Right Left   Disc Pink and  Sharp, Compact Pink and Sharp, Compact, mild tilt   C/D Ratio 0.3 0.3   Macula Flat, Blunted foveal reflex, RPE mottling and clumping, No heme or edema Flat, Blunted foveal reflex, RPE mottling and clumping, focal druse SN to fovea, No heme or edema   Vessels mild attenuation, mild tortuousity mild  attenuation, mild tortuousity   Periphery Attached, No RT/RD, No heme  Attached, No RT/RD, No heme         Refraction    Wearing Rx      Sphere   Right OTC   Left OTC       Manifest Refraction      Sphere Cylinder Dist VA   Right +0.00 Sphere 20/20-1   Left +0.00 Sphere 20/20-2          IMAGING AND PROCEDURES  Imaging and Procedures for 03/15/2020  OCT, Retina - OU - Both Eyes       Right Eye Quality was good. Central Foveal Thickness: 252. Progression has no prior data. Findings include normal foveal contour, no IRF, no SRF.   Left Eye Quality was good. Central Foveal Thickness: 251. Progression has no prior data. Findings include normal foveal contour, no IRF, no SRF (Single druse).   Notes *Images captured and stored on drive  Diagnosis / Impression:  NFP, no IRF/SRF OU  Clinical management:  See below  Abbreviations: NFP - Normal foveal profile. CME - cystoid macular edema. PED - pigment epithelial detachment. IRF - intraretinal fluid. SRF - subretinal fluid. EZ - ellipsoid zone. ERM - epiretinal membrane. ORA - outer retinal atrophy. ORT - outer retinal tubulation. SRHM - subretinal hyper-reflective material. IRHM - intraretinal hyper-reflective material                 ASSESSMENT/PLAN:    ICD-10-CM   1. Posterior vitreous detachment of both eyes  H43.813   2. Retinal edema  H35.81 OCT, Retina - OU - Both Eyes  3. Combined forms of age-related cataract of both eyes  H25.813   4. History of laser assisted in situ keratomileusis  Z98.890     1,2. PVD / vitreous syneresis OU  - symptomatic flashes/floaters after hitting head at work  - intermittent  flashes with head movement; floaters mild OU  - Discussed findings and prognosis  - No RT or RD on 360 peripheral exam  - Reviewed s/s of RT/RD  - Strict return precautions for any such RT/RD signs/symptoms  - f/u in 4-6 wks -- DFE/OCT  3. Mixed Cataract OU - The symptoms of cataract, surgical options, and treatments and risks were discussed with patient. - discussed diagnosis and progression - not yet visually significant - monitor for now  4. Hx of Lasik OU - Dr. Delaney Meigs (>20 years ago)    Ophthalmic Meds Ordered this visit:  No orders of the defined types were placed in this encounter.      Return for f/u 4-6 weeks, PVD OU, DFE, OCT.  There are no Patient Instructions on file for this visit.   Explained the diagnoses, plan, and follow up with the patient and they expressed understanding.  Patient expressed understanding of the importance of proper follow up care.  This document serves as a record of services personally performed by Karie Chimera, MD, PhD. It was created on their behalf by Cristopher Estimable, COT an ophthalmic technician. The creation of this record is the provider's dictation and/or activities during the visit.    Electronically signed by: Cristopher Estimable, COT 9.20.21 @ 9:16 AM   This document serves as a record of services personally performed by Karie Chimera, MD, PhD. It was created on their behalf by Glee Arvin. Manson Passey, OA an ophthalmic technician. The creation of this record is the provider's dictation and/or activities during the visit.    Electronically signed  by: Glee Arvin Manson Passey, [place] 09.21.2021 9:16 AM   Karie Chimera, M.D., Ph.D. Diseases & Surgery of the Retina and Vitreous [facility]  I have reviewed the above documentation for accuracy and completeness, and I agree with the above. Karie Chimera, M.D., Ph.D. 03/15/20 9:16 AM   Abbreviations: M myopia (nearsighted); A astigmatism; H hyperopia (farsighted); P  presbyopia; Mrx spectacle prescription;  CTL contact lenses; OD right eye; OS left eye; OU both eyes  XT exotropia; ET esotropia; PEK punctate epithelial keratitis; PEE punctate epithelial erosions; DES dry eye syndrome; MGD meibomian gland dysfunction; ATs artificial tears; PFAT's preservative free artificial tears; NSC nuclear sclerotic cataract; PSC posterior subcapsular cataract; ERM epi-retinal membrane; PVD posterior vitreous detachment; RD retinal detachment; DM diabetes mellitus; DR diabetic retinopathy; NPDR non-proliferative diabetic retinopathy; PDR proliferative diabetic retinopathy; CSME clinically significant macular edema; DME diabetic macular edema; dbh dot blot hemorrhages; CWS cotton wool spot; POAG primary open angle glaucoma; C/D cup-to-disc ratio; HVF humphrey visual field; GVF goldmann visual field; OCT optical coherence tomography; IOP intraocular pressure; BRVO Branch retinal vein occlusion; CRVO central retinal vein occlusion; CRAO central retinal artery occlusion; BRAO branch retinal artery occlusion; RT retinal tear; SB scleral buckle; PPV pars plana vitrectomy; VH Vitreous hemorrhage; PRP panretinal laser photocoagulation; IVK intravitreal kenalog; VMT vitreomacular traction; MH Macular hole;  NVD neovascularization of the disc; NVE neovascularization elsewhere; AREDS age related eye disease study; ARMD age related macular degeneration; POAG primary open angle glaucoma; EBMD epithelial/anterior basement membrane dystrophy; ACIOL anterior chamber intraocular lens; IOL intraocular lens; PCIOL posterior chamber intraocular lens; Phaco/IOL phacoemulsification with intraocular lens placement; PRK photorefractive keratectomy; LASIK laser assisted in situ keratomileusis; HTN hypertension; DM diabetes mellitus; COPD chronic obstructive pulmonary disease

## 2020-03-15 ENCOUNTER — Other Ambulatory Visit: Payer: Self-pay

## 2020-03-15 ENCOUNTER — Encounter (INDEPENDENT_AMBULATORY_CARE_PROVIDER_SITE_OTHER): Payer: Self-pay | Admitting: Ophthalmology

## 2020-03-15 ENCOUNTER — Ambulatory Visit (INDEPENDENT_AMBULATORY_CARE_PROVIDER_SITE_OTHER): Payer: Managed Care, Other (non HMO) | Admitting: Ophthalmology

## 2020-03-15 DIAGNOSIS — Z9889 Other specified postprocedural states: Secondary | ICD-10-CM | POA: Diagnosis not present

## 2020-03-15 DIAGNOSIS — H43813 Vitreous degeneration, bilateral: Secondary | ICD-10-CM | POA: Diagnosis not present

## 2020-03-15 DIAGNOSIS — H25813 Combined forms of age-related cataract, bilateral: Secondary | ICD-10-CM

## 2020-03-15 DIAGNOSIS — H3581 Retinal edema: Secondary | ICD-10-CM | POA: Diagnosis not present

## 2020-04-26 ENCOUNTER — Encounter (INDEPENDENT_AMBULATORY_CARE_PROVIDER_SITE_OTHER): Payer: Managed Care, Other (non HMO) | Admitting: Ophthalmology

## 2020-04-26 DIAGNOSIS — H43813 Vitreous degeneration, bilateral: Secondary | ICD-10-CM

## 2020-04-26 DIAGNOSIS — H3581 Retinal edema: Secondary | ICD-10-CM

## 2020-04-26 DIAGNOSIS — H25813 Combined forms of age-related cataract, bilateral: Secondary | ICD-10-CM

## 2020-04-26 DIAGNOSIS — Z9889 Other specified postprocedural states: Secondary | ICD-10-CM

## 2020-04-27 ENCOUNTER — Other Ambulatory Visit: Payer: Self-pay | Admitting: Obstetrics & Gynecology

## 2020-05-03 ENCOUNTER — Other Ambulatory Visit: Payer: Self-pay | Admitting: Orthopaedic Surgery

## 2020-05-03 NOTE — Telephone Encounter (Signed)
Left message for patient to call back about an appointment with Dr. Hilda Lias.

## 2020-05-03 NOTE — Telephone Encounter (Signed)
I need to see before any narcotic.  It has been over three months.  That's the law.

## 2020-05-03 NOTE — Telephone Encounter (Signed)
Patient was last seen in June. Does she need a follow up? Please advise.

## 2020-06-01 ENCOUNTER — Other Ambulatory Visit: Payer: Self-pay | Admitting: Obstetrics & Gynecology

## 2020-06-19 ENCOUNTER — Other Ambulatory Visit: Payer: Self-pay

## 2020-06-19 ENCOUNTER — Ambulatory Visit
Admission: EM | Admit: 2020-06-19 | Discharge: 2020-06-19 | Disposition: A | Discharge status: Home / Self Care | Payer: Managed Care, Other (non HMO)

## 2020-06-19 DIAGNOSIS — R519 Headache, unspecified: Secondary | ICD-10-CM

## 2020-06-19 DIAGNOSIS — Z1152 Encounter for screening for COVID-19: Secondary | ICD-10-CM

## 2020-06-19 DIAGNOSIS — J0140 Acute pansinusitis, unspecified: Secondary | ICD-10-CM

## 2020-06-19 DIAGNOSIS — J3489 Other specified disorders of nose and nasal sinuses: Secondary | ICD-10-CM

## 2020-06-19 MED ORDER — AMOXICILLIN-POT CLAVULANATE 875-125 MG PO TABS: 1.0000 | ORAL_TABLET | Freq: Two times a day (BID) | ORAL | 0 refills | Status: AC

## 2020-06-19 NOTE — Discharge Instructions (Signed)
I have sent in Augmentin for you to take twice a day for 7 days. ° °Your COVID and Flu tests are pending.  You should self quarantine until the test results are back.   ° °Take Tylenol or ibuprofen as needed for fever or discomfort.  Rest and keep yourself hydrated.   ° °Follow-up with your primary care provider if your symptoms are not improving.   ° ° °

## 2020-06-19 NOTE — ED Triage Notes (Signed)
Pt presents with nasal congestion and sinus pressure and pain

## 2020-06-21 NOTE — ED Provider Notes (Signed)
[facility]   [id number] 06/19/20 Arrival Time: 1116  GE:XBMW THROAT  SUBJECTIVE: History from: patient.  Tamara Terry is a 59 y.o. female who presents with abrupt onset of nasal congestion, headache, fatigue, sinus pain and pressure for the last 4 days. Reports that she had a cold for the last week prior to this. Denies sick exposure to Covid, strep, flu or mono, or precipitating event. Has negative history of Covid. Has not had Covid vaccines. Has tried OTC cough and cold witthout relief. There are no aggravating symptoms. Reports previous symptoms in the past and hx sinus infections.  Denies fever, chills, ear pain, rhinorrhea, cough, SOB, wheezing, chest pain, nausea, rash, changes in bowel or bladder habits.    ROS: As per HPI.  All other pertinent ROS negative.     Past Medical History:  Diagnosis Date  . Chronic back pain   . Depression   . GERD (gastroesophageal reflux disease)   . Interstitial cystitis   . Neuropathy of both feet   . Obesity   . Plantar fasciitis, bilateral    Past Surgical History:  Procedure Laterality Date  . CHOLECYSTECTOMY  1990  . CYSTO WITH HYDRODISTENSION  11/07/2011   Procedure: CYSTOSCOPY/HYDRODISTENSION;  Surgeon: Marcine Matar, MD;  Location: [facility];  Service: Urology;  Laterality: N/A;  PYRIDIUM AND MARCAINE  . HYSTEROSCOPY WITH D & C  07-21-2004   Allergies  Allergen Reactions  . Azithromycin   . Latex    Current Facility-Administered Medications on File Prior to Encounter  Medication Dose Route Frequency Provider Last Rate Last Admin  . bupivacaine (MARCAINE) 0.5 % 15 mL, phenazopyridine (PYRIDIUM) 400 mg bladder mixture   Bladder Instillation Once Marcine Matar, MD       Current Outpatient Medications on File Prior to Encounter  Medication Sig Dispense Refill  . cetirizine (ZYRTEC) 10 MG tablet Take 10 mg by mouth daily.    . Fe Fum-FePoly-FA-Vit C-Vit B3 (FOLIVANE-F) 125-1 MG CAPS TAKE 1  CAPSULE BY MOUTH TWICE A DAY (EVERY 12 HOURS) 60 capsule 6  . HYDROcodone-acetaminophen (NORCO/VICODIN) 5-325 MG tablet TAKE ONE TABLET BY MOUTH EVERY FOUR HOURS 30 tablet 0  . ibuprofen (ADVIL,MOTRIN) 200 MG tablet Take 400 mg by mouth every 6 (six) hours as needed. For pain    . meloxicam (MOBIC) 7.5 MG tablet Take 7.5 mg by mouth daily.    Marland Kitchen omeprazole (PRILOSEC) 20 MG capsule Take 1 capsule (20 mg total) by mouth 2 (two) times daily. (Patient not taking: Reported on 11/10/2019) 180 capsule 4  . omeprazole (PRILOSEC) 20 MG capsule TAKE ONE (1) CAPSULE BY MOUTH TWICE A DAY. (EVERY 12 HOURS.) 60 capsule 11  . omeprazole (PRILOSEC) 20 MG capsule TAKE ONE (1) CAPSULE BY MOUTH TWICE A DAY. (EVERY 12 HOURS.) 60 capsule 11  . phentermine 30 MG capsule Take 30 mg by mouth every morning.    . sertraline (ZOLOFT) 50 MG tablet Take 50 mg by mouth daily.    . traMADol (ULTRAM) 50 MG tablet Take 50 mg by mouth every evening.     Social History   Socioeconomic History  . Marital status: Married    Spouse name: Not on file  . Number of children: Not on file  . Years of education: Not on file  . Highest education level: Not on file  Occupational History  . Not on file  Tobacco Use  . Smoking status: Former Smoker    Types: Cigarettes    Quit  date: 06/25/1994    Years since quitting: 26.0  . Smokeless tobacco: Never Used  Substance and Sexual Activity  . Alcohol use: No  . Drug use: No  . Sexual activity: Not Currently    Birth control/protection: Post-menopausal  Other Topics Concern  . Not on file  Social History Narrative  . Not on file   Social Determinants of Health   Financial Resource Strain: Not on file  Food Insecurity: Not on file  Transportation Needs: Not on file  Physical Activity: Not on file  Stress: Not on file  Social Connections: Not on file  Intimate Partner Violence: Not on file   Family History  Problem Relation Age of Onset  . Heart disease Mother   . Diabetes  Mother   . Hypertension Mother   . Parkinson's disease Mother   . Kidney disease Mother   . Colon polyps Mother   . Heart attack Father   . Diabetes Sister   . Diabetes Maternal Grandmother   . Cancer Maternal Grandfather     OBJECTIVE:  Vitals:   06/19/20 1235  BP: (!) 141/67  Pulse: 76  Resp: 18  Temp: 98.1 F (36.7 C)  TempSrc: Oral  SpO2: 95%   General appearance: alert; appears fatigued, but nontoxic, speaking in full sentences and managing own secretions HEENT: NCAT; Ears: EACs clear, TMs pearly gray with visible cone of light, without erythema; Eyes: PERRL, EOMI grossly; Nose: no obvious rhinorrhea; Throat: oropharynx mildly erythematous, cobblestoning present, tonsils 1+ and mildly erythematous without white tonsillar exudates, uvula midline; Sinuses: maxillary sinuses tender to palpation Neck: supple with LAD Lungs: CTA bilaterally without adventitious breath sounds; cough absent Heart: regular rate and rhythm. Radial pulses 2+ symmetrical bilaterally Skin: warm and dry Psychological: alert and cooperative; normal mood and affect  LABS: No results found for this or any previous visit (from the past 24 hour(s)).   ASSESSMENT & PLAN:  1. Acute non-recurrent pansinusitis   2. Encounter for screening for COVID-19   3. Facial pain   4. Sinus pain     Meds ordered this encounter  Medications  . amoxicillin-clavulanate (AUGMENTIN) 875-125 MG tablet    Sig: Take 1 tablet by mouth 2 (two) times daily for 7 days.    Dispense:  14 tablet    Refill:  0    Order Specific Question:   Supervising Provider    Answer:   Merrilee Jansky [id number]    Acute Sinusitis Push fluids and get rest Prescribed amoxicillin 875mg  twice daily for 10 days.   Covid testing today.  Self isolate until results are back and negative If results are positive, isolate for 10 days from today Take as directed and to completion.  Drink warm or cool liquids, use throat lozenges, or  popsicles to help alleviate symptoms Take OTC ibuprofen or tylenol as needed for pain May use Zyrtec D and flonase to help alleviate symptoms Follow up with PCP if symptoms persist Return or go to ER if you have any new or worsening symptoms such as fever, chills, nausea, vomiting, worsening sore throat, cough, abdominal pain, chest pain, changes in bowel or bladder habits.   Reviewed expectations re: course of current medical issues. Questions answered. Outlined signs and symptoms indicating need for more acute intervention. Patient verbalized understanding. After Visit Summary given.          , NP 06/21/20 [phone]

## 2020-06-22 LAB — NOVEL CORONAVIRUS, NAA: SARS-CoV-2, NAA: NOT DETECTED

## 2020-11-25 ENCOUNTER — Other Ambulatory Visit: Payer: Self-pay | Admitting: Obstetrics & Gynecology

## 2021-04-25 ENCOUNTER — Telehealth: Payer: Self-pay | Admitting: *Deleted

## 2021-04-25 NOTE — Telephone Encounter (Signed)
Pt's insurance is not wanting to cover Omeprazole 20 mg. Pt was advised this med was OTC so she can purchase it OTC or she can pay cash price at pharmacy. Pt voiced understanding. JSY

## 2021-06-12 ENCOUNTER — Other Ambulatory Visit: Payer: Self-pay | Admitting: Obstetrics & Gynecology

## 2021-06-29 ENCOUNTER — Other Ambulatory Visit: Payer: Self-pay | Admitting: Obstetrics & Gynecology

## 2021-07-04 ENCOUNTER — Other Ambulatory Visit: Payer: Self-pay | Admitting: *Deleted

## 2021-12-28 DIAGNOSIS — R7301 Impaired fasting glucose: Secondary | ICD-10-CM | POA: Diagnosis not present

## 2021-12-28 DIAGNOSIS — E782 Mixed hyperlipidemia: Secondary | ICD-10-CM | POA: Diagnosis not present

## 2021-12-29 ENCOUNTER — Emergency Department (HOSPITAL_COMMUNITY)
Admission: EM | Admit: 2021-12-29 | Discharge: 2021-12-29 | Disposition: A | Discharge status: Home / Self Care | Payer: 59 | Attending: Emergency Medicine | Admitting: Emergency Medicine

## 2021-12-29 ENCOUNTER — Emergency Department (HOSPITAL_COMMUNITY): Payer: 59

## 2021-12-29 ENCOUNTER — Encounter (HOSPITAL_COMMUNITY): Payer: Self-pay | Admitting: Emergency Medicine

## 2021-12-29 ENCOUNTER — Other Ambulatory Visit: Payer: Self-pay

## 2021-12-29 DIAGNOSIS — R109 Unspecified abdominal pain: Secondary | ICD-10-CM | POA: Diagnosis not present

## 2021-12-29 DIAGNOSIS — W08XXXA Fall from other furniture, initial encounter: Secondary | ICD-10-CM | POA: Insufficient documentation

## 2021-12-29 DIAGNOSIS — I2510 Atherosclerotic heart disease of native coronary artery without angina pectoris: Secondary | ICD-10-CM | POA: Diagnosis not present

## 2021-12-29 DIAGNOSIS — R4182 Altered mental status, unspecified: Secondary | ICD-10-CM | POA: Diagnosis not present

## 2021-12-29 DIAGNOSIS — M546 Pain in thoracic spine: Secondary | ICD-10-CM | POA: Diagnosis not present

## 2021-12-29 DIAGNOSIS — S199XXA Unspecified injury of neck, initial encounter: Secondary | ICD-10-CM | POA: Diagnosis not present

## 2021-12-29 DIAGNOSIS — M47814 Spondylosis without myelopathy or radiculopathy, thoracic region: Secondary | ICD-10-CM | POA: Diagnosis not present

## 2021-12-29 DIAGNOSIS — R072 Precordial pain: Secondary | ICD-10-CM | POA: Diagnosis not present

## 2021-12-29 DIAGNOSIS — D1800 Hemangioma unspecified site: Secondary | ICD-10-CM | POA: Diagnosis not present

## 2021-12-29 DIAGNOSIS — W19XXXA Unspecified fall, initial encounter: Secondary | ICD-10-CM

## 2021-12-29 DIAGNOSIS — S299XXA Unspecified injury of thorax, initial encounter: Secondary | ICD-10-CM | POA: Diagnosis not present

## 2021-12-29 DIAGNOSIS — I700 Atherosclerosis of aorta: Secondary | ICD-10-CM | POA: Diagnosis not present

## 2021-12-29 DIAGNOSIS — R0782 Intercostal pain: Secondary | ICD-10-CM | POA: Diagnosis not present

## 2021-12-29 DIAGNOSIS — Z91040 Latex allergy status: Secondary | ICD-10-CM | POA: Diagnosis not present

## 2021-12-29 DIAGNOSIS — R1012 Left upper quadrant pain: Secondary | ICD-10-CM | POA: Insufficient documentation

## 2021-12-29 DIAGNOSIS — R0781 Pleurodynia: Secondary | ICD-10-CM | POA: Diagnosis not present

## 2021-12-29 DIAGNOSIS — R52 Pain, unspecified: Secondary | ICD-10-CM | POA: Diagnosis not present

## 2021-12-29 DIAGNOSIS — R911 Solitary pulmonary nodule: Secondary | ICD-10-CM | POA: Diagnosis not present

## 2021-12-29 LAB — CBC WITH DIFFERENTIAL/PLATELET
Abs Immature Granulocytes: 0.13 10*3/uL — ABNORMAL HIGH (ref 0.00–0.07)
Basophils Absolute: 0.1 10*3/uL (ref 0.0–0.1)
Basophils Relative: 1 %
Eosinophils Absolute: 0.1 10*3/uL (ref 0.0–0.5)
Eosinophils Relative: 2 %
HCT: 39.5 % (ref 36.0–46.0)
Hemoglobin: 13.4 g/dL (ref 12.0–15.0)
Immature Granulocytes: 2 %
Lymphocytes Relative: 29 %
Lymphs Abs: 2.3 10*3/uL (ref 0.7–4.0)
MCH: 32 pg (ref 26.0–34.0)
MCHC: 33.9 g/dL (ref 30.0–36.0)
MCV: 94.3 fL (ref 80.0–100.0)
Monocytes Absolute: 0.6 10*3/uL (ref 0.1–1.0)
Monocytes Relative: 7 %
Neutro Abs: 4.7 10*3/uL (ref 1.7–7.7)
Neutrophils Relative %: 59 %
Platelets: 206 10*3/uL (ref 150–400)
RBC: 4.19 MIL/uL (ref 3.87–5.11)
RDW: 11.8 % (ref 11.5–15.5)
WBC: 7.8 10*3/uL (ref 4.0–10.5)
nRBC: 0 % (ref 0.0–0.2)

## 2021-12-29 LAB — COMPREHENSIVE METABOLIC PANEL
ALT: 31 U/L (ref 0–44)
AST: 26 U/L (ref 15–41)
Albumin: 3.9 g/dL (ref 3.5–5.0)
Alkaline Phosphatase: 87 U/L (ref 38–126)
Anion gap: 5 (ref 5–15)
BUN: 20 mg/dL (ref 6–20)
CO2: 27 mmol/L (ref 22–32)
Calcium: 9.8 mg/dL (ref 8.9–10.3)
Chloride: 106 mmol/L (ref 98–111)
Creatinine, Ser: 0.42 mg/dL — ABNORMAL LOW (ref 0.44–1.00)
GFR, Estimated: >60 mL/min (ref >60)
Glucose, Bld: 100 mg/dL — ABNORMAL HIGH (ref 70–99)
Potassium: 4.1 mmol/L (ref 3.5–5.1)
Sodium: 138 mmol/L (ref 135–145)
Total Bilirubin: 0.5 mg/dL (ref 0.3–1.2)
Total Protein: 6.8 g/dL (ref 6.5–8.1)

## 2021-12-29 MED ORDER — HYDROMORPHONE HCL 1 MG/ML IJ SOLN
1.0000 mg | Freq: Once | INTRAMUSCULAR | Status: AC
  Administered 2021-12-29: 1 mg via INTRAVENOUS
  Filled 2021-12-29: qty 1

## 2021-12-29 MED ORDER — SODIUM CHLORIDE 0.9 % IV BOLUS
250.0000 mL | Freq: Once | INTRAVENOUS | Status: AC
  Administered 2021-12-29: 250 mL via INTRAVENOUS

## 2021-12-29 MED ORDER — OXYCODONE-ACETAMINOPHEN 5-325 MG PO TABS: 1.0000 | ORAL_TABLET | Freq: Four times a day (QID) | ORAL | 0 refills | Status: DC | PRN

## 2021-12-29 MED ORDER — IOHEXOL 300 MG/ML  SOLN
75.0000 mL | Freq: Once | INTRAMUSCULAR | Status: AC | PRN
  Administered 2021-12-29: 75 mL via INTRAVENOUS

## 2021-12-29 NOTE — ED Notes (Signed)
C-collar applied to pt.

## 2021-12-29 NOTE — Discharge Instructions (Signed)
Rest this weekend and follow-up with your doctor next week if any problem

## 2021-12-29 NOTE — ED Notes (Addendum)
Pt states she when she fell, she hit her head on a wooden bench, no LOC, pt is not on blood thinners--Md made aware

## 2021-12-29 NOTE — ED Triage Notes (Signed)
Pt arrived via [facility] for a fall at [employer] [facility]. Pt c/o L sided rib pain. BP 138/79 per EMS

## 2021-12-29 NOTE — Progress Notes (Signed)
April and I removed 2 gold hoop earrings from patient and placed in ziploc bag and gave back to patient.

## 2021-12-29 NOTE — ED Provider Notes (Signed)
[facility] EMERGENCY DEPARTMENT Provider Note   CSN: [id number] Arrival date & time: 12/29/21  1696     History  No chief complaint on file.   Tamara Terry is a 61 y.o. female.  Patient had a fall and complains of pain to her upper back and left ribs and left upper quadrant.  Patient has history of GERD.  Patient has no loss of consciousness but did hit her head  The history is provided by the patient and medical records.  Fall This is a new problem. The current episode started 1 to 2 hours ago. The problem occurs rarely. The problem has not changed since onset.Associated symptoms include chest pain. Pertinent negatives include no abdominal pain and no headaches. Nothing aggravates the symptoms. Nothing relieves the symptoms. She has tried nothing for the symptoms.       Home Medications Prior to Admission medications   Medication Sig Start Date End Date Taking? Authorizing Provider  oxyCODONE-acetaminophen (PERCOCET/ROXICET) 5-325 MG tablet Take 1 tablet by mouth every 6 (six) hours as needed for severe pain. 12/29/21  Yes Bethann Berkshire, MD  cetirizine (ZYRTEC) 10 MG tablet Take 10 mg by mouth daily.    [provider]  Fe Fum-FePoly-FA-Vit C-Vit B3 (FOLIVANE-F) 125-1 MG CAPS TAKE 1 CAPSULE BY MOUTH TWICE A DAY (EVERY 12 HOURS) 07/04/21   Lazaro Arms, MD  HYDROcodone-acetaminophen (NORCO/VICODIN) 5-325 MG tablet TAKE ONE TABLET BY MOUTH EVERY FOUR HOURS 02/02/20   Darreld Mclean, MD  ibuprofen (ADVIL,MOTRIN) 200 MG tablet Take 400 mg by mouth every 6 (six) hours as needed. For pain    [provider]  meloxicam (MOBIC) 7.5 MG tablet Take 7.5 mg by mouth daily. 06/13/20   [provider]  omeprazole (PRILOSEC) 20 MG capsule Take 1 capsule (20 mg total) by mouth 2 (two) times daily. Patient not taking: Reported on 11/10/2019 02/27/17   Lazaro Arms, MD  omeprazole (PRILOSEC) 20 MG capsule TAKE ONE (1) CAPSULE BY MOUTH TWICE A DAY. (EVERY 12 HOURS.)  04/28/19   Lazaro Arms, MD  omeprazole (PRILOSEC) 20 MG capsule TAKE ONE (1) CAPSULE BY MOUTH TWICE A DAY. (EVERY 12 HOURS.) 06/02/20   Lazaro Arms, MD  omeprazole (PRILOSEC) 20 MG capsule TAKE ONE (1) CAPSULE BY MOUTH TWICE A DAY. (EVERY 12 HOURS.) 06/13/21   Lazaro Arms, MD  phentermine 30 MG capsule Take 30 mg by mouth every morning.    [provider]  sertraline (ZOLOFT) 50 MG tablet Take 50 mg by mouth daily. 09/30/19   [provider]  traMADol (ULTRAM) 50 MG tablet Take 50 mg by mouth every evening.    [provider]      Allergies    Azithromycin and Latex    Review of Systems   Review of Systems  Constitutional:  Negative for appetite change and fatigue.  HENT:  Negative for congestion, ear discharge and sinus pressure.   Eyes:  Negative for discharge.  Respiratory:  Negative for cough.   Cardiovascular:  Positive for chest pain.  Gastrointestinal:  Negative for abdominal pain and diarrhea.  Genitourinary:  Negative for frequency and hematuria.  Musculoskeletal:  Positive for back pain.  Skin:  Negative for rash.  Neurological:  Negative for seizures and headaches.  Psychiatric/Behavioral:  Negative for hallucinations.     Physical Exam Updated Vital Signs BP 127/66   Pulse 61   Temp 98.2 F (36.8 C) (Oral)   Resp 17   Ht 5'  6" (1.676 m)   Wt 86.2 kg   SpO2 95%   BMI 30.67 kg/m  Physical Exam Vitals and nursing note reviewed.  Constitutional:      Appearance: She is well-developed.  HENT:     Head: Normocephalic.     Nose: Nose normal.  Eyes:     General: No scleral icterus.    Conjunctiva/sclera: Conjunctivae normal.  Neck:     Thyroid: No thyromegaly.  Cardiovascular:     Rate and Rhythm: Normal rate and regular rhythm.     Heart sounds: No murmur heard.    No friction rub. No gallop.  Pulmonary:     Breath sounds: No stridor. No wheezing or rales.     Comments: Tender left lateral ribs Chest:     Chest wall:  Tenderness present.  Abdominal:     General: There is no distension.     Tenderness: There is abdominal tenderness. There is no rebound.     Comments: Tender left upper quadrant  Musculoskeletal:        General: Normal range of motion.     Cervical back: Neck supple.     Comments: Tender thoracic spine  Lymphadenopathy:     Cervical: No cervical adenopathy.  Skin:    Findings: No erythema or rash.  Neurological:     Mental Status: She is alert and oriented to person, place, and time.     Motor: No abnormal muscle tone.     Coordination: Coordination normal.  Psychiatric:        Behavior: Behavior normal.     ED Results / Procedures / Treatments   Labs (all labs ordered are listed, but only abnormal results are displayed) Labs Reviewed  CBC WITH DIFFERENTIAL/PLATELET - Abnormal; Notable for the following components:      Result Value   Abs Immature Granulocytes 0.13 (*)    All other components within normal limits  COMPREHENSIVE METABOLIC PANEL - Abnormal; Notable for the following components:   Glucose, Bld 100 (*)    Creatinine, Ser 0.42 (*)    All other components within normal limits    EKG None  Radiology CT T-SPINE NO CHARGE  Result Date: 12/29/2021 CLINICAL DATA:  Provided history: Fall. Left-sided rib pain. EXAM: CT THORACIC SPINE WITHOUT CONTRAST TECHNIQUE: Multidetector CT images of the thoracic were obtained using the standard protocol without intravenous contrast. RADIATION DOSE REDUCTION: This exam was performed according to the departmental dose-optimization program which includes automated exposure control, adjustment of the mA and/or kV according to patient size and/or use of iterative reconstruction technique. COMPARISON:  Prior chest radiographs 12/29/2021 and earlier FINDINGS: Alignment: Dextrocurvature of the upper thoracic spine. Levocurvature of the midthoracic spine. No significant spondylolisthesis. Vertebrae: Vertebral body height is maintained. No  evidence of acute fracture to the thoracic spine. Multilevel vertebral body hemangiomas. Paraspinal and other soft tissues: No abnormality identified within included portions of the thorax or upper abdomen/retroperitoneum. No paraspinal hematoma, mass or collection. Disc levels: Multilevel disc space narrowing, greatest within the mid-to-lower thoracic spine (moderate at these levels). Degenerative facet spurring contributes to multilevel bony neural foraminal narrowing, greatest bilaterally at T2-T3, bilaterally at T3-T4, bilaterally at T4-T5, on the left at T8-T9 and bilateral at T9-T10. IMPRESSION: 1. No evidence of acute fracture to the thoracic spine. 2. Dextrocurvature of the upper thoracic spine with midthoracic levocurvature. 3. Thoracic spondylosis, as described. Electronically Signed   By: Kellie Simmering D.O.   On: 12/29/2021 12:37   CT Chest W Contrast  Result Date: 12/29/2021 CLINICAL DATA:  A 61 year old female presents following blunt trauma after fall down steps. EXAM: CT CHEST WITH CONTRAST TECHNIQUE: Multidetector CT imaging of the chest was performed during intravenous contrast administration. RADIATION DOSE REDUCTION: This exam was performed according to the departmental dose-optimization program which includes automated exposure control, adjustment of the mA and/or kV according to patient size and/or use of iterative reconstruction technique. CONTRAST:  24mL OMNIPAQUE IOHEXOL 300 MG/ML  SOLN COMPARISON:  CT of the thoracic spine which was acquired concurrently and is reported separately. FINDINGS: Cardiovascular: Aortic caliber is normal. Aortic contour is smooth. There are signs of calcified and noncalcified atherosclerotic plaque in the thoracic aorta. No acute aortic process. The heart size is normal without pericardial effusion. Small amount of low-density in the anterior mediastinum does not distort mediastinal contours and measures water density or lower compatible with small superior  pericardial recess Central pulmonary vasculature is mild-to-moderately engorged. Mediastinum/Nodes: Small amount of fluid density likely in superior pericardial recess. No mediastinal hematoma. No acute process related to the esophagus grossly. The no pneumomediastinum. No thoracic inlet adenopathy or axillary lymphadenopathy. No mediastinal or hilar lymphadenopathy. Lungs/Pleura: Basilar scarring or atelectasis. No effusion. No pneumothorax. Small periphery calcified nodule in the RIGHT middle lobe measures 3 mm compatible with prior granulomatous disease. Airways are patent. Upper Abdomen: Incidental imaging of upper abdominal contents with signs of hepatic steatosis. Areas of focal fat intensification anterior to the porta hepatis. Liver span is likely enlarged but incompletely imaged. Post cholecystectomy. Imaged portions of the pancreas, spleen and adrenal glands without acute process, similarly upper pole of LEFT and RIGHT kidney with unremarkable appearance. Splenomegaly at 14 cm greatest craniocaudal span. Musculoskeletal: No contusion to the body wall visible by CT. No acute or destructive bone process spinal degenerative changes. IMPRESSION: 1. No acute findings in the chest. 2. Marked hepatic steatosis likely with hepatomegaly and with splenomegaly. Correlate with clinical or laboratory evidence of liver disease. 3. Post cholecystectomy. 4. Aortic atherosclerosis. Aortic Atherosclerosis (ICD10-I70.0). Electronically Signed   By: Zetta Bills M.D.   On: 12/29/2021 12:37   CT Head Wo Contrast  Result Date: 12/29/2021 CLINICAL DATA:  Head trauma, abnormal mental status (Age 63-64y); Neck trauma, dangerous injury mechanism (Age 79-64y) CT EXAM: CT HEAD WITHOUT CONTRAST CT CERVICAL SPINE WITHOUT CONTRAST TECHNIQUE: Multidetector CT imaging of the head and cervical spine was performed following the standard protocol without intravenous contrast. Multiplanar CT image reconstructions of the cervical spine  were also generated. RADIATION DOSE REDUCTION: This exam was performed according to the departmental dose-optimization program which includes automated exposure control, adjustment of the mA and/or kV according to patient size and/or use of iterative reconstruction technique. COMPARISON:  Cervical spine April 20, 2011. Cervical radiographs November 24, 2019. FINDINGS: CT HEAD FINDINGS Brain: No evidence of acute infarction, hemorrhage, hydrocephalus, extra-axial collection or mass lesion/mass effect. Vascular: No hyperdense vessel identified. Skull: No acute fracture. Sinuses/Orbits: Clear sinuses.  No acute orbital findings. Other: No mastoid effusions. CT CERVICAL SPINE FINDINGS Alignment: In comparison to cervical radiographs, similar anterolisthesis of C5 on C6, which is favored degenerative given severe right-sided facet arthropathy at this level. Otherwise, no substantial sagittal subluxation. Skull base and vertebrae: Mild height loss of the C5 vertebral body anteriorly peers similar to the priors. No new height loss or evidence of acute fracture. Soft tissues and spinal canal: No prevertebral fluid or swelling. No visible canal hematoma. Disc levels:  Severe right-sided facet arthropathy at C5-C6 Upper chest: See concurrent CT  of the chest for intrathoracic evaluation. IMPRESSION: CT head: No evidence of acute intracranial abnormality. CT cervical spine: 1. No evidence of acute fracture. 2. Severe right-sided facet arthropathy at C5-C6 with probably degenerative anterolisthesis at this level. Electronically Signed   By: Margaretha Sheffield M.D.   On: 12/29/2021 12:25   CT Cervical Spine Wo Contrast  Result Date: 12/29/2021 CLINICAL DATA:  Head trauma, abnormal mental status (Age 50-64y); Neck trauma, dangerous injury mechanism (Age 57-64y) CT EXAM: CT HEAD WITHOUT CONTRAST CT CERVICAL SPINE WITHOUT CONTRAST TECHNIQUE: Multidetector CT imaging of the head and cervical spine was performed following the standard  protocol without intravenous contrast. Multiplanar CT image reconstructions of the cervical spine were also generated. RADIATION DOSE REDUCTION: This exam was performed according to the departmental dose-optimization program which includes automated exposure control, adjustment of the mA and/or kV according to patient size and/or use of iterative reconstruction technique. COMPARISON:  Cervical spine April 20, 2011. Cervical radiographs November 24, 2019. FINDINGS: CT HEAD FINDINGS Brain: No evidence of acute infarction, hemorrhage, hydrocephalus, extra-axial collection or mass lesion/mass effect. Vascular: No hyperdense vessel identified. Skull: No acute fracture. Sinuses/Orbits: Clear sinuses.  No acute orbital findings. Other: No mastoid effusions. CT CERVICAL SPINE FINDINGS Alignment: In comparison to cervical radiographs, similar anterolisthesis of C5 on C6, which is favored degenerative given severe right-sided facet arthropathy at this level. Otherwise, no substantial sagittal subluxation. Skull base and vertebrae: Mild height loss of the C5 vertebral body anteriorly peers similar to the priors. No new height loss or evidence of acute fracture. Soft tissues and spinal canal: No prevertebral fluid or swelling. No visible canal hematoma. Disc levels:  Severe right-sided facet arthropathy at C5-C6 Upper chest: See concurrent CT of the chest for intrathoracic evaluation. IMPRESSION: CT head: No evidence of acute intracranial abnormality. CT cervical spine: 1. No evidence of acute fracture. 2. Severe right-sided facet arthropathy at C5-C6 with probably degenerative anterolisthesis at this level. Electronically Signed   By: Margaretha Sheffield M.D.   On: 12/29/2021 12:25   US Abdomen Complete  Result Date: 12/29/2021 CLINICAL DATA:  Left upper quadrant abdominal pain. EXAM: ABDOMEN ULTRASOUND COMPLETE COMPARISON:  None Available. FINDINGS: Gallbladder: No gallstones or wall thickening visualized. No sonographic Murphy  sign noted by sonographer. Common bile duct: Diameter: 4 mm Liver: No focal lesion identified. Within normal limits in parenchymal echogenicity. Portal vein is patent on color Doppler imaging with normal direction of blood flow towards the liver. IVC: No abnormality visualized. Pancreas: Visualized portion unremarkable. Spleen: Size and appearance within normal limits. Right Kidney: Length: 12.4 cm. Echogenicity within normal limits. No mass or hydronephrosis visualized. Left Kidney: Length: 12.4 cm. Echogenicity within normal limits. No mass or hydronephrosis visualized. Abdominal aorta: No aneurysm visualized. Other findings: None. IMPRESSION: No acute or significant abnormal sonographic finding in the abdomen. Electronically Signed   By: Dahlia Bailiff M.D.   On: 12/29/2021 10:59   DG Chest Port 1 View  Result Date: 12/29/2021 CLINICAL DATA:  Left-sided rib pain after a fall this morning. EXAM: PORTABLE CHEST 1 VIEW COMPARISON:  Chest radiograph 04/20/2011 FINDINGS: Cardiomediastinal contours are within normal limits. Lungs are clear. No pneumothorax or significant pleural effusion. No acute finding identified in the visualized skeleton. IMPRESSION: No pneumothorax or displaced left-sided rib fracture identified on single frontal view. Electronically Signed   By: Audie Pinto M.D.   On: 12/29/2021 10:32    Procedures Procedures    Medications Ordered in ED Medications  sodium chloride 0.9 % bolus  250 mL (250 mLs Intravenous Bolus 12/29/21 1020)  HYDROmorphone (DILAUDID) injection 1 mg (1 mg Intravenous Given 12/29/21 1143)  iohexol (OMNIPAQUE) 300 MG/ML solution 75 mL (75 mLs Intravenous Contrast Given 12/29/21 1152)    ED Course/ Medical Decision Making/ A&P                           Medical Decision Making Amount and/or Complexity of Data Reviewed Labs: ordered. Radiology: ordered.  Risk Prescription drug management.  ,mil This patient presents to the ED for concern of fall, this  involves an extensive number of treatment options, and is a complaint that carries with it a high risk of complications and morbidity.  The differential diagnosis includes head injury, thoracic spine fracture, rib fracture   Co morbidities that complicate the patient evaluation  GERD   Additional history obtained:  Additional history obtained from friend External records from outside source obtained and reviewed including hospital record   Lab Tests:  I Ordered, and personally interpreted labs.  The pertinent results include: CBC and chemistries negative   Imaging Studies ordered:  I ordered imaging studies including ultrasound of the abdomen and, CT chest thoracic spine head and cervical spine all negative.  Chest x-ray I independently visualized and interpreted imaging which showed all films negative I agree with the radiologist interpretation   Cardiac Monitoring: / EKG:  The patient was maintained on a cardiac monitor.  I personally viewed and interpreted the cardiac monitored which showed an underlying rhythm of: Normal sinus rhythm   Consultations Obtained:  No consult  Problem List / ED Course / Critical interventions / Medication management  Fall, GERD I ordered medication including Dilaudid for pain Reevaluation of the patient after these medicines showed that the patient improved I have reviewed the patients home medicines and have made adjustments as needed   Social Determinants of Health:  None   Test / Admission - Considered:  No other test necessary  Patient with fall.  Patient has contusions to upper back left chest left upper abdomen and head.  Patient given pain medicine and will rest at home and follow-up with PCP as needed       Final Clinical Impression(s) / ED Diagnoses Final diagnoses:  Fall, initial encounter    Rx / DC Orders ED Discharge Orders          Ordered    oxyCODONE-acetaminophen (PERCOCET/ROXICET) 5-325 MG tablet   Every 6 hours PRN        12/29/21 1326              Bethann Berkshire, MD 12/29/21 1648

## 2022-01-16 DIAGNOSIS — Z0001 Encounter for general adult medical examination with abnormal findings: Secondary | ICD-10-CM | POA: Diagnosis not present

## 2022-01-16 DIAGNOSIS — F411 Generalized anxiety disorder: Secondary | ICD-10-CM | POA: Diagnosis not present

## 2022-01-16 DIAGNOSIS — M791 Myalgia, unspecified site: Secondary | ICD-10-CM | POA: Diagnosis not present

## 2022-01-16 DIAGNOSIS — E782 Mixed hyperlipidemia: Secondary | ICD-10-CM | POA: Diagnosis not present

## 2022-01-16 DIAGNOSIS — E209 Hypoparathyroidism, unspecified: Secondary | ICD-10-CM | POA: Diagnosis not present

## 2022-01-16 DIAGNOSIS — K219 Gastro-esophageal reflux disease without esophagitis: Secondary | ICD-10-CM | POA: Diagnosis not present

## 2022-01-16 DIAGNOSIS — R69 Illness, unspecified: Secondary | ICD-10-CM | POA: Diagnosis not present

## 2022-01-22 ENCOUNTER — Other Ambulatory Visit: Payer: Self-pay | Admitting: Obstetrics & Gynecology

## 2022-04-24 ENCOUNTER — Encounter: Payer: Self-pay | Admitting: Orthopaedic Surgery

## 2022-04-24 ENCOUNTER — Ambulatory Visit: Payer: 59 | Admitting: Orthopaedic Surgery

## 2022-04-24 VITALS — Ht 65.0 in | Wt 211.2 lb

## 2022-04-24 DIAGNOSIS — M79641 Pain in right hand: Secondary | ICD-10-CM | POA: Diagnosis not present

## 2022-04-24 DIAGNOSIS — M7712 Lateral epicondylitis, left elbow: Secondary | ICD-10-CM | POA: Diagnosis not present

## 2022-04-24 NOTE — Progress Notes (Signed)
My elbow hurts.  She has developed pain of the lateral elbow on the left when lifting things.  This began about three to four weeks ago and is getting worse.  Nothing seems to help.  She cannot take NSAIDs.  She has tried copper elbow sleeve, heat and some ice as well as Tylenol.  She has no trauma, no redness, no numbness.  She has pain to resisted dorsiflexion of the wrist and long finger on the left.  NV intact. She has no redness.  Encounter Diagnoses  Name Primary?   Lateral epicondylitis, left elbow Yes   Right hand pain    I have explained what she has  I have recommended ice massage.  I have recommended Voltaren Gel to elbow and to the right hand which hurts more in the early morning.  I also mentioned paraffin bath.  Return in two weeks.  Call if any problem.  Precautions discussed.  Electronically Signed Sanjuana Kava, MD 10/31/20233:19 PM

## 2022-04-24 NOTE — Patient Instructions (Addendum)
You have tennis elbow.  Tennis elbow is easy to get but HARD TO GET RID OF.   Aspercreme, Biofreeze, Blue Emu or Voltaren Gel over the counter 2-3 times daily. Rub into area well each use for best results.  Do the ice massage as directed. This will help the most. Ice your elbow for 15-20 minutes, wipe it off, then use a cream and massage it into the area. Massage it well, but not to the point you are in tears.   You can get an tennis elbow band at [employer] or [place] or [employer].   We will see you in a few weeks. It won't be completely cured then, but hopefully you will be feeling better by your next visit.   As the weather changes and gets cooler, you may notice you are affected more. You may have more pain in your joints. This is normal. Dress warmly and make sure that area is covered well.

## 2022-05-08 ENCOUNTER — Ambulatory Visit: Payer: 59 | Admitting: Orthopaedic Surgery

## 2022-05-08 ENCOUNTER — Encounter: Payer: Self-pay | Admitting: Orthopaedic Surgery

## 2022-05-08 DIAGNOSIS — M7712 Lateral epicondylitis, left elbow: Secondary | ICD-10-CM | POA: Diagnosis not present

## 2022-05-08 MED ORDER — METHYLPREDNISOLONE ACETATE 40 MG/ML IJ SUSP
40.0000 mg | Freq: Once | INTRAMUSCULAR | Status: AC
  Administered 2022-05-08: 40 mg via INTRA_ARTICULAR

## 2022-05-08 NOTE — Progress Notes (Signed)
My elbow is worse.  She has more pain of the left elbow over the lateral epicondyle.  Procedure note: After permission from the patient, the left lateral epicondyle was prepped.  I used 1% Xylocaine and 1 cc DepoMedrol 40 and injected by sterile technique into the lateral epicondyle on the left tolerated well.  Encounter Diagnosis  Name Primary?   Lateral epicondylitis, left elbow Yes   Return in three weeks.  Continue ice massage.  Call if any problem.  Precautions discussed.  Electronically Signed Darreld Mclean, MD 11/14/20233:22 PM

## 2022-05-22 ENCOUNTER — Encounter: Payer: Self-pay | Admitting: Orthopaedic Surgery

## 2022-05-22 ENCOUNTER — Ambulatory Visit: Payer: 59 | Admitting: Orthopaedic Surgery

## 2022-05-22 VITALS — Ht 65.0 in | Wt 205.0 lb

## 2022-05-22 DIAGNOSIS — M79642 Pain in left hand: Secondary | ICD-10-CM

## 2022-05-22 DIAGNOSIS — M79641 Pain in right hand: Secondary | ICD-10-CM | POA: Diagnosis not present

## 2022-05-22 MED ORDER — CELECOXIB 200 MG PO CAPS: ORAL_CAPSULE | ORAL | 5 refills | Status: DC

## 2022-05-22 NOTE — Patient Instructions (Addendum)
STOP the Meloxicam START the Celebrex  It will take several days for Meloxicam to get out of your system and for the Celebrex to get into your system at a steady level. You must take the medication with a meal to try and decrease the stomach side effects  Because the weather is getting colder, your hands will hurt a lot more. When you go outside, even for a couple of minutes, make sure they are covered.

## 2022-05-22 NOTE — Progress Notes (Signed)
My hands hurt in the morning.  She has pain in both hands in the morning, more on the right.  She has tried heat, rubs with little help.  She is on Mobic.  That does not help like it used to.  She has some swelling but no redness.  She has no trauma.  The right hand has slight swelling more at PIP joints but no redness. ROM is good but tender.  NV intact.  Grips good.  Encounter Diagnosis  Name Primary?   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  She should continue to use heat in the mornings.  She may need to see rheumatology.  Stop the Mobic.  Begin Celebrex 200 one a day for about a week and make sure it agrees with her then she can increase to two a day after eating.  Return in six weeks.  Call if any problem.  Precautions discussed.  Electronically Signed Darreld Mclean, MD 11/28/20232:54 PM

## 2022-05-29 ENCOUNTER — Ambulatory Visit: Payer: 59 | Admitting: Orthopaedic Surgery

## 2022-06-06 DIAGNOSIS — G43909 Migraine, unspecified, not intractable, without status migrainosus: Secondary | ICD-10-CM | POA: Diagnosis not present

## 2022-06-06 DIAGNOSIS — G43009 Migraine without aura, not intractable, without status migrainosus: Secondary | ICD-10-CM | POA: Diagnosis not present

## 2022-06-21 DIAGNOSIS — H9313 Tinnitus, bilateral: Secondary | ICD-10-CM | POA: Diagnosis not present

## 2022-06-21 DIAGNOSIS — J0110 Acute frontal sinusitis, unspecified: Secondary | ICD-10-CM | POA: Diagnosis not present

## 2022-07-03 ENCOUNTER — Ambulatory Visit: Payer: 59 | Admitting: Orthopaedic Surgery

## 2022-08-08 ENCOUNTER — Ambulatory Visit: Payer: 59 | Admitting: Adult Health

## 2022-08-14 DIAGNOSIS — R350 Frequency of micturition: Secondary | ICD-10-CM | POA: Diagnosis not present

## 2022-08-16 ENCOUNTER — Ambulatory Visit (INDEPENDENT_AMBULATORY_CARE_PROVIDER_SITE_OTHER): Payer: 59 | Admitting: Adult Health

## 2022-08-16 ENCOUNTER — Encounter: Payer: Self-pay | Admitting: Adult Health

## 2022-08-16 ENCOUNTER — Other Ambulatory Visit (HOSPITAL_COMMUNITY)
Admission: RE | Admit: 2022-08-16 | Discharge: 2022-08-16 | Disposition: A | Discharge status: Home / Self Care | Payer: 59 | Source: Ambulatory Visit | Attending: Adult Health | Admitting: Adult Health

## 2022-08-16 VITALS — BP 117/73 | HR 70 | Ht 65.0 in | Wt 215.0 lb

## 2022-08-16 DIAGNOSIS — Z1231 Encounter for screening mammogram for malignant neoplasm of breast: Secondary | ICD-10-CM | POA: Insufficient documentation

## 2022-08-16 DIAGNOSIS — N764 Abscess of vulva: Secondary | ICD-10-CM

## 2022-08-16 DIAGNOSIS — Z1211 Encounter for screening for malignant neoplasm of colon: Secondary | ICD-10-CM | POA: Insufficient documentation

## 2022-08-16 DIAGNOSIS — Z01419 Encounter for gynecological examination (general) (routine) without abnormal findings: Secondary | ICD-10-CM | POA: Insufficient documentation

## 2022-08-16 LAB — HEMOCCULT GUIAC POC 1CARD (OFFICE): Fecal Occult Blood, POC: NEGATIVE

## 2022-08-16 MED ORDER — SULFAMETHOXAZOLE-TRIMETHOPRIM 800-160 MG PO TABS: 1.0000 | ORAL_TABLET | Freq: Two times a day (BID) | ORAL | 0 refills | Status: DC

## 2022-08-16 NOTE — Progress Notes (Signed)
Patient ID: Tamara Terry, female   DOB: 11-Jan-1961, 62 y.o.   MRN: [id number] History of Present Illness: Tamara Terry is a 62 year old white female,married, PM in for a well woman gyn exam and pap. She is being treated for UTI was seen at Urgent Care. She said she had bump left labia and she squeezed and foul fluid came out, still slightly swollen, and tender.  PCP is Dr Nevada Crane.    Current Medications, Allergies, Past Medical History, Past Surgical History, Family History and Social History were reviewed in [employer] record.     Review of Systems: Patient denies any headaches, hearing loss, fatigue, blurred vision, shortness of breath, chest pain, abdominal pain, problems with bowel movements, urination(current UTI), or intercourse(not active). No joint pain or mood swings.  See HPI for positives.    Physical Exam:BP 117/73 (BP Location: Left Arm, Patient Position: Sitting, Cuff Size: Large)   Pulse 70   Ht 5' 5"$  (1.651 m)   Wt 215 lb (97.5 kg)   BMI 35.78 kg/m   General:  Well developed, well nourished, no acute distress Skin:  Warm and dry Neck:  Midline trachea, normal thyroid, good ROM, no lymphadenopathy,no carotid bruits heard Lungs; Clear to auscultation bilaterally Breast:  No dominant palpable mass, retraction, or nipple discharge Cardiovascular: Regular rate and rhythm Abdomen:  Soft, non tender, no hepatosplenomegaly Pelvic:  External genitalia is normal in appearance, left labia is swollen and has scab. The vagina is pale. Urethra has no lesions or masses. The cervix is smooth, pap with HR HPV genotyping performed.  Uterus is felt to be normal size, shape, and contour.  No adnexal masses or tenderness noted.Bladder is non tender, no masses felt. Rectal: Good sphincter tone, no polyps, or hemorrhoids felt.  Hemoccult negative. Extremities/musculoskeletal:  No swelling or varicosities noted, no clubbing or cyanosis Psych:  No mood changes, alert and  cooperative,seems happy AA is 1 Fall risk is low    08/16/2022    3:15 PM  Depression screen PHQ 2/9  Decreased Interest 0  Down, Depressed, Hopeless 0  PHQ - 2 Score 0  Altered sleeping 0  Tired, decreased energy 1  Change in appetite 1  Feeling bad or failure about yourself  0  Trouble concentrating 0  Moving slowly or fidgety/restless 0  Suicidal thoughts 0  PHQ-9 Score 2       08/16/2022    3:16 PM  GAD 7 : Generalized Anxiety Score  Nervous, Anxious, on Edge 0  Control/stop worrying 0  Worry too much - different things 1  Trouble relaxing 1  Restless 0  Easily annoyed or irritable 1  Afraid - awful might happen 0  Total GAD 7 Score 3      Upstream - 08/16/22 1514       Pregnancy Intention Screening   Does the patient want to become pregnant in the next year? N/A    Does the patient's partner want to become pregnant in the next year? N/A    Would the patient like to discuss contraceptive options today? N/A      Contraception Wrap Up   Current Method No Method - Other Reason   postmenopausal   Reason for No Current Contraceptive Method at Intake ([facility] Only) Other    End Method No Method - Other Reason   postmenopausal   Contraception Counseling Provided No            Examination chaperoned by Levy Pupa LPN  Impression and Plan: 1. Encounter for gynecological examination with Papanicolaou smear of cervix Pap sent Pap in 3 years if normal Physical in 1 year - Cytology - PAP( [place]) Labs with PCP  2. Encounter for screening fecal occult blood testing Hemoccult was negative  - POCT occult blood stool  3. Screening for colorectal cancer Will order cologuard, she has had 1 colonoscopy  - Cologuard  4. Screening mammogram for breast cancer Scheduled mammogram for her for 08/22/22 at 3:30 pm - MM 3D SCREEN BREAST BILATERAL; Future  5. Labial abscess Will rx septra ds Use warm compresses Do not squeeze Review handout on abscess Meds  ordered this encounter  Medications   sulfamethoxazole-trimethoprim (BACTRIM DS) 800-160 MG tablet    Sig: Take 1 tablet by mouth 2 (two) times daily. Take 1 bid    Dispense:  28 tablet    Refill:  0    Order Specific Question:   Supervising Provider    Answer:   Elonda Husky, LUTHER H [[id number]]   Recheck in 2 weeks

## 2022-08-17 ENCOUNTER — Other Ambulatory Visit: Payer: Self-pay | Admitting: *Deleted

## 2022-08-17 ENCOUNTER — Other Ambulatory Visit: Payer: Self-pay | Admitting: Adult Health

## 2022-08-17 MED ORDER — SULFAMETHOXAZOLE-TRIMETHOPRIM 800-160 MG PO TABS: 1.0000 | ORAL_TABLET | Freq: Two times a day (BID) | ORAL | 0 refills | Status: DC

## 2022-08-17 MED ORDER — OMEPRAZOLE 20 MG PO CPDR: DELAYED_RELEASE_CAPSULE | ORAL | 11 refills | Status: AC

## 2022-08-17 NOTE — Progress Notes (Signed)
Resent rx for septra ds to [facility]

## 2022-08-21 LAB — CYTOLOGY - PAP
Comment: NEGATIVE
Diagnosis: NEGATIVE
High risk HPV: NEGATIVE

## 2022-08-22 ENCOUNTER — Ambulatory Visit (HOSPITAL_COMMUNITY): Payer: 59

## 2022-08-23 ENCOUNTER — Encounter: Payer: Self-pay | Admitting: Radiology

## 2022-08-29 ENCOUNTER — Ambulatory Visit: Payer: 59 | Admitting: Adult Health

## 2022-09-12 DIAGNOSIS — E782 Mixed hyperlipidemia: Secondary | ICD-10-CM | POA: Diagnosis not present

## 2022-09-12 DIAGNOSIS — E209 Hypoparathyroidism, unspecified: Secondary | ICD-10-CM | POA: Diagnosis not present

## 2022-09-15 DIAGNOSIS — Z1211 Encounter for screening for malignant neoplasm of colon: Secondary | ICD-10-CM | POA: Diagnosis not present

## 2022-09-15 DIAGNOSIS — Z1212 Encounter for screening for malignant neoplasm of rectum: Secondary | ICD-10-CM | POA: Diagnosis not present

## 2022-09-17 ENCOUNTER — Other Ambulatory Visit (HOSPITAL_COMMUNITY): Payer: Self-pay | Admitting: Family Medicine

## 2022-09-17 DIAGNOSIS — Z13820 Encounter for screening for osteoporosis: Secondary | ICD-10-CM

## 2022-09-20 ENCOUNTER — Encounter: Payer: Self-pay | Admitting: Nurse Practitioner

## 2022-09-21 LAB — COLOGUARD: COLOGUARD: NEGATIVE

## 2022-10-31 NOTE — Patient Instructions (Signed)
Hypercalcemia Hypercalcemia is when the level of calcium in a person's blood is above normal. The body needs calcium to make bones and keep them strong. Calcium also helps the muscles, nerves, brain, and heart work the way they should. Most of the calcium in the body is stored in the bones. There is also calcium in the blood. Hypercalcemia occurs when there is too much calcium in your blood. Calcium levels in the blood are regulated by hormones, kidneys, and the gastrointestinal tract.  Hypercalcemia can happen when calcium comes out of the bones, or when the kidneys are not able to remove calcium from the blood. Hypercalcemia can be mild or severe. What are the causes? There are many possible causes of hypercalcemia. Common causes of this condition include: Hyperparathyroidism. This is a condition in which the body produces too much parathyroid hormone. There are four parathyroid glands in your neck. These glands produce a chemical messenger (hormone) that helps the body absorb calcium from foods and helps your bones release calcium. Certain kinds of cancer. Less common causes of hypercalcemia include: Calcium and vitamin D dietary supplements. Chronic kidney disease. Hyperthyroidism. Severe dehydration. Being on bed rest or being inactive for a long time. Certain medicines. Infections. What increases the risk? You are more likely to develop this condition if: You are female. You are 60 years of age or older. You have a family history of hypercalcemia. What are the signs or symptoms? Mild hypercalcemia that starts slowly may not cause symptoms. Severe, sudden hypercalcemia is more likely to cause symptoms, such as: Being more thirsty than usual. Needing to urinate more often than usual. Abdominal pain. Nausea and vomiting. Constipation. Muscle pain, twitching, or weakness. Feeling very tired. How is this diagnosed?  Hypercalcemia is usually diagnosed with a blood test. You may also  have tests to help check what is causing this condition. Tests include imaging tests and more blood tests. How is this treated? Treatment for hypercalcemia depends on the cause. Treatment may include: Receiving fluids through an IV. Medicines. These can be used to: Keep calcium levels steady after receiving fluids (loop diuretics). Keep calcium in your bones (bisphosphonates). Lower the calcium level in your blood. Surgery to remove overactive parathyroid glands. A procedure that filters your blood to correct calcium levels (hemodialysis). Follow these instructions at home:  Take over-the-counter and prescription medicines only as told by your health care provider. Follow instructions from your health care provider about eating or drinking restrictions. Drink enough fluid to keep your urine pale yellow. Stay active. Weight-bearing exercise helps to keep calcium in your bones. Follow instructions from your health care provider about what type and level of exercise is safe for you. Keep all follow-up visits. This is important. Contact a health care provider if: You have a fever. Your heartbeat is irregular or very fast. You have changes in mood, memory, or personality. Get help right away if: You have severe abdominal pain. You have chest pain. You have trouble breathing. You become very confused and sleepy. You lose consciousness. These symptoms may represent a serious problem that is an emergency. Do not wait to see if the symptoms will go away. Get medical help right away. Call your local emergency services (911 in the [place] Do not drive yourself to the hospital. Summary Hypercalcemia is when the level of calcium in a person's blood is above normal. The body needs calcium to make bones and keep them strong. There are many possible causes of hypercalcemia, and treatment depends on   the cause. Take over-the-counter and prescription medicines only as told by your health care  provider. This information is not intended to replace advice given to you by your health care provider. Make sure you discuss any questions you have with your health care provider. Document Revised: 11/16/2020 Document Reviewed: 11/16/2020 Elsevier Patient Education  2023 Elsevier Inc.  

## 2022-11-02 ENCOUNTER — Encounter: Payer: Self-pay | Admitting: Nurse Practitioner

## 2022-11-02 ENCOUNTER — Ambulatory Visit: Payer: 59 | Admitting: Nurse Practitioner

## 2022-11-02 DIAGNOSIS — E213 Hyperparathyroidism, unspecified: Secondary | ICD-10-CM | POA: Diagnosis not present

## 2022-11-02 NOTE — Progress Notes (Signed)
Endocrinology Consult Note       11/02/2022, 10:48 AM   SUBJECTIVE: Tamara Terry is a 62 y.o.-year-old female, referred by her  Benita Stabile, MD  , for evaluation for hypercalcemia/hyperparathyroidism.   Past Medical History:  Diagnosis Date   Chronic back pain    Depression    GERD (gastroesophageal reflux disease)    Interstitial cystitis    Neuropathy of both feet    Obesity    Plantar fasciitis, bilateral     Past Surgical History:  Procedure Laterality Date   CHOLECYSTECTOMY  1990   CYSTO WITH HYDRODISTENSION  11/07/2011   Procedure: CYSTOSCOPY/HYDRODISTENSION;  Surgeon: Marcine Matar, MD;  Location[facility];  Service: Urology;  Laterality: N/A;  PYRIDIUM AND MARCAINE   HYSTEROSCOPY WITH D & C  07-21-2004    Social History   Tobacco Use   Smoking status: Former    Types: Cigarettes    Quit date: 06/25/1994    Years since quitting: 28.3   Smokeless tobacco: Never  Vaping Use   Vaping Use: Never used  Substance Use Topics   Alcohol use: Yes   Drug use: No    Family History  Problem Relation Age of Onset   Heart disease Mother    Diabetes Mother    Hypertension Mother    Parkinson's disease Mother    Kidney disease Mother    Colon polyps Mother    Heart attack Father    Diabetes Sister    Diabetes Maternal Grandmother    Cancer Maternal Grandfather     Outpatient Encounter Medications as of 11/02/2022  Medication Sig   cetirizine (ZYRTEC) 10 MG tablet Take 10 mg by mouth daily.   Cholecalciferol (VITAMIN D-3 PO) Take by mouth.   Fe Fum-FePoly-FA-Vit C-Vit B3 (FOLIVANE-F) 125-1 MG CAPS Take by mouth.   meloxicam (MOBIC) 15 MG tablet Take 15 mg by mouth daily.   Multiple Vitamins-Minerals (MULTIVITAMIN GUMMIES ADULT) CHEW Chew by mouth daily.   omeprazole (PRILOSEC) 20 MG capsule TAKE ONE (1) CAPSULE BY MOUTH TWICE A DAY. (EVERY 12 HOURS.)   sertraline (ZOLOFT) 50 MG tablet  Take 50 mg by mouth daily.   [DISCONTINUED] Cyanocobalamin (VITAMIN B-12 PO) Take by mouth.   [DISCONTINUED] ciprofloxacin (CIPRO) 500 MG tablet Take 500 mg by mouth 2 (two) times daily. (Patient not taking: Reported on 11/02/2022)   [DISCONTINUED] Ferrous Sulfate (IRON PO) Take by mouth. (Patient not taking: Reported on 11/02/2022)   [DISCONTINUED] sulfamethoxazole-trimethoprim (BACTRIM DS) 800-160 MG tablet Take 1 tablet by mouth 2 (two) times daily. Take 1 bid (Patient not taking: Reported on 11/02/2022)   Facility-Administered Encounter Medications as of 11/02/2022  Medication   bupivacaine (MARCAINE) 0.5 % 15 mL, phenazopyridine (PYRIDIUM) 400 mg bladder mixture    Allergies  Allergen Reactions   Azithromycin    Latex      HPI  Tamara Terry was diagnosed with hypercalcemia in 2015 with highest calcium level of 11.2 in 2019.  Her PTH hormone has also been high since then as well.  She has never had any specialist care or further work up for this in the past.  Patient  has no previously known history of parathyroid, pituitary, adrenal dysfunctions; no family history of such dysfunctions.  -Review of herreferral package of most recent labs reveals calcium of 10.8 the corresponding PTH of 93 on 09/13/22.  She has never had a DEXA scan.  No prior history of fragility fractures or falls. She thinks she had a kidney stone during the pandemic but was unable to seek care due to restrictions.  No history of CKD. Last BUN/Cr: 12/0.54  she is not on HCTZ or other thiazide therapy.  No history of vitamin D deficiency. No records that vitamin D has been checked previously, she is on Vitamin D supplement with OTC vitamin d3 2000 units daily.  she is not on calcium supplements (aside from her womens MVI which she only recently started),  she eats dairy and green, leafy, vegetables in small amounts.  she does not have a family history of hypercalcemia, pituitary tumors, thyroid cancer, or  osteoporosis.   I reviewed her chart and she also has a history of GERD, HLD, migraines, anxiety.    ROS:  Constitutional: no weight gain/loss, no fatigue, no subjective hyperthermia, no subjective hypothermia Eyes: no blurry vision, no xerophthalmia ENT: no sore throat, no nodules palpated in throat, no dysphagia/odynophagia, no hoarseness Cardiovascular: no Chest Pain, no Shortness of Breath, no palpitations, no leg swelling Respiratory: no cough, no shortness of breath  Gastrointestinal: no Nausea/Vomiting/Diarrhea Musculoskeletal: no muscle/joint aches Skin: no rashes Neurological: no tremors, no numbness, no tingling, no dizziness Psychiatric: no depression, no anxiety  ------------------------------------------------------------------------------------------------------------------------------- OBJECTIVE: BP 118/76 (BP Location: Left Arm, Patient Position: Sitting, Cuff Size: Large)   Pulse 67   Ht 5\' 5"  (1.651 m)   Wt 210 lb 9.6 oz (95.5 kg)   BMI 35.05 kg/m , Body mass index is 35.05 kg/m. Wt Readings from Last 3 Encounters:  11/02/22 210 lb 9.6 oz (95.5 kg)  08/16/22 215 lb (97.5 kg)  05/22/22 205 lb (93 kg)    BP Readings from Last 3 Encounters:  11/02/22 118/76  08/16/22 117/73  12/29/21 (!) 134/58     Physical Exam- Limited  Constitutional:  Body mass index is 35.05 kg/m. , not in acute distress, normal state of mind Eyes:  EOMI, no exophthalmos Neck: Supple Cardiovascular: RRR, no murmurs, rubs, or gallops, no edema Respiratory: Adequate breathing efforts, no crackles, rales, rhonchi, or wheezing Musculoskeletal: no gross deformities, strength intact in all four extremities, no gross restriction of joint movements Skin:  no rashes, no hyperemia Neurological: no tremor with outstretched hands   CMP ( most recent) CMP     Component Value Date/Time   NA 138 12/29/2021 1009   K 4.1 12/29/2021 1009   CL 106 12/29/2021 1009   CO2 27 12/29/2021 1009    GLUCOSE 100 (H) 12/29/2021 1009   BUN 20 12/29/2021 1009   CREATININE 0.42 (L) 12/29/2021 1009   CREATININE 0.60 04/12/2014 1658   CALCIUM 9.8 12/29/2021 1009   PROT 6.8 12/29/2021 1009   ALBUMIN 3.9 12/29/2021 1009   AST 26 12/29/2021 1009   ALT 31 12/29/2021 1009   ALKPHOS 87 12/29/2021 1009   BILITOT 0.5 12/29/2021 1009   GFRNONAA >60 12/29/2021 1009   GFRAA >90 04/20/2011 1220     Diabetic Labs (most recent): No results found for: "HGBA1C", "MICROALBUR"   Lipid Panel ( most recent) Lipid Panel     Component Value Date/Time   CHOL 211 (H) 04/12/2014 1658   TRIG 309 (H) 04/12/2014 1658   HDL 40  04/12/2014 1658   CHOLHDL 5.3 04/12/2014 1658   VLDL 62 (H) 04/12/2014 1658   LDLCALC 109 (H) 04/12/2014 1658      Lab Results  Component Value Date   TSH 1.522 04/12/2014      Assessment/Plan: 1. Hypercalcemia / Hyperparathyroidism  Patient has had several instances of elevated calcium, with the highest level being at 11.2 mg/dL back in 1610. Most recent calcium level was 10.8 and a corresponding intact PTH level was also high, at 93.   - No apparent complications from hypercalcemia/hyperparathyroidism: no history of  nephrolithiasis,  osteoporosis,fragility fractures. No abdominal pain, no major mood disorders, no bone pain.  - I discussed with the patient about the physiology of calcium and parathyroid hormone, and possible  effects of  increased PTH/ Calcium , including kidney stones, cardiac dysrhythmias, osteoporosis, abdominal pain, etc.   - The work up so far is not sufficient to reach a conclusion for definitive therapy.  she needs more studies to confirm and classify the parathyroid dysfunction she may have. I will proceed to obtain repeat intact PTH/calcium, serum magnesium, serum phosphorus, PTH-rp, thyroid function, and CMP.  It is also essential to obtain 24-hour urine calcium/creatinine to rule out the rare but important cause of mild elevation in calcium and  PTH- FHH ( Familial Hypocalciuric Hypercalcemia), which may not require any active intervention.  - I will request for her next DEXA scan to include the distal 33% of  radius for evaluation of cortical bone, which is predominantly affected by hyperparathyroidism.   I did order this to be done prior to next visit.    - Time spent with the patient: 54 minutes, of which >50% was spent in obtaining information about her symptoms, reviewing her previous labs, evaluations, and treatments, counseling her about her  high blood calcium levels , and developing a plan to confirm the diagnosis and long term treatment as necessary.  Please refer to " Patient Self Inventory" in the Media  tab for reviewed elements of pertinent patient history.  [name] participated in the discussions, expressed understanding, and voiced agreement with the above plans.  All questions were answered to her satisfaction. she is encouraged to contact clinic should she have any questions or concerns prior to her return visit.   FOLLOW UP PLAN: - Return in about 2 months (around 01/02/2023) for hypercalcemia follow up with labs, 24 hr urine, and DEXA scan.   Ronny Bacon, [facility] [address] [place], [place] [address] Phone: [phone] Fax: [phone]  11/02/2022, 10:48 AM

## 2022-11-05 DIAGNOSIS — E213 Hyperparathyroidism, unspecified: Secondary | ICD-10-CM | POA: Diagnosis not present

## 2022-11-06 LAB — CALCIUM, URINE, 24 HOUR
Calcium, 24H Urine: 171 mg/24 hr (ref 0–320)
Calcium, Urine: 21.4 mg/dL

## 2022-11-06 LAB — CREATININE, URINE, 24 HOUR
Creatinine, 24H Ur: 452 mg/24 hr — ABNORMAL LOW (ref 800–1800)
Creatinine, Urine: 56.5 mg/dL

## 2022-11-08 LAB — COMPREHENSIVE METABOLIC PANEL
ALT: 25 IU/L (ref 0–32)
AST: 23 IU/L (ref 0–40)
Albumin/Globulin Ratio: 2 (ref 1.2–2.2)
Albumin: 4.8 g/dL (ref 3.9–4.9)
Alkaline Phosphatase: 101 IU/L (ref 44–121)
BUN/Creatinine Ratio: 24 (ref 12–28)
BUN: 12 mg/dL (ref 8–27)
Bilirubin Total: 0.4 mg/dL (ref 0.0–1.2)
CO2: 25 mmol/L (ref 20–29)
Calcium: 11.1 mg/dL — ABNORMAL HIGH (ref 8.7–10.3)
Chloride: 102 mmol/L (ref 96–106)
Creatinine, Ser: 0.51 mg/dL — ABNORMAL LOW (ref 0.57–1.00)
Globulin, Total: 2.4 g/dL (ref 1.5–4.5)
Glucose: 98 mg/dL (ref 70–99)
Potassium: 4.4 mmol/L (ref 3.5–5.2)
Sodium: 140 mmol/L (ref 134–144)
Total Protein: 7.2 g/dL (ref 6.0–8.5)
eGFR: 106 mL/min/{1.73_m2} (ref >59)

## 2022-11-08 LAB — PTH, INTACT AND CALCIUM: PTH: 78 pg/mL — ABNORMAL HIGH (ref 15–65)

## 2022-11-08 LAB — T4, FREE: Free T4: 1 ng/dL (ref 0.82–1.77)

## 2022-11-08 LAB — MAGNESIUM: Magnesium: 2.2 mg/dL (ref 1.6–2.3)

## 2022-11-08 LAB — VITAMIN D 25 HYDROXY (VIT D DEFICIENCY, FRACTURES): Vit D, 25-Hydroxy: 34.4 ng/mL (ref 30.0–100.0)

## 2022-11-08 LAB — PHOSPHORUS: Phosphorus: 3.2 mg/dL (ref 3.0–4.3)

## 2022-11-08 LAB — PTH-RELATED PEPTIDE: PTH-related peptide: <2 pmol/L

## 2022-11-08 LAB — TSH: TSH: 2.35 u[IU]/mL (ref 0.450–4.500)

## 2022-11-16 ENCOUNTER — Telehealth: Payer: Self-pay | Admitting: *Deleted

## 2022-11-16 NOTE — Telephone Encounter (Signed)
Pt stated she will pick up Omeprazole 20 mg. No need to do PA. JSY

## 2022-11-21 ENCOUNTER — Other Ambulatory Visit (HOSPITAL_COMMUNITY): Payer: 59

## 2022-11-22 ENCOUNTER — Other Ambulatory Visit (HOSPITAL_COMMUNITY): Payer: Self-pay | Admitting: Adult Health

## 2022-11-22 DIAGNOSIS — Z1231 Encounter for screening mammogram for malignant neoplasm of breast: Secondary | ICD-10-CM

## 2022-11-28 ENCOUNTER — Ambulatory Visit (HOSPITAL_COMMUNITY)
Admission: RE | Admit: 2022-11-28 | Discharge: 2022-11-28 | Disposition: A | Discharge status: Home / Self Care | Payer: 59 | Source: Ambulatory Visit | Attending: Nurse Practitioner | Admitting: Nurse Practitioner

## 2022-11-28 DIAGNOSIS — E213 Hyperparathyroidism, unspecified: Secondary | ICD-10-CM | POA: Diagnosis not present

## 2022-11-28 DIAGNOSIS — M810 Age-related osteoporosis without current pathological fracture: Secondary | ICD-10-CM | POA: Diagnosis not present

## 2022-11-30 ENCOUNTER — Encounter (HOSPITAL_COMMUNITY): Payer: Self-pay

## 2022-11-30 ENCOUNTER — Ambulatory Visit (HOSPITAL_COMMUNITY)
Admission: RE | Admit: 2022-11-30 | Discharge: 2022-11-30 | Disposition: A | Discharge status: Home / Self Care | Payer: 59 | Source: Ambulatory Visit | Attending: Adult Health | Admitting: Adult Health

## 2022-11-30 DIAGNOSIS — Z1231 Encounter for screening mammogram for malignant neoplasm of breast: Secondary | ICD-10-CM | POA: Insufficient documentation

## 2022-12-25 DIAGNOSIS — R350 Frequency of micturition: Secondary | ICD-10-CM | POA: Diagnosis not present

## 2022-12-25 DIAGNOSIS — N390 Urinary tract infection, site not specified: Secondary | ICD-10-CM | POA: Diagnosis not present

## 2022-12-25 DIAGNOSIS — Z7689 Persons encountering health services in other specified circumstances: Secondary | ICD-10-CM | POA: Diagnosis not present

## 2023-01-09 ENCOUNTER — Encounter: Payer: Self-pay | Admitting: Nurse Practitioner

## 2023-01-09 ENCOUNTER — Ambulatory Visit: Payer: 59 | Admitting: Nurse Practitioner

## 2023-01-09 DIAGNOSIS — E210 Primary hyperparathyroidism: Secondary | ICD-10-CM

## 2023-01-09 DIAGNOSIS — E213 Hyperparathyroidism, unspecified: Secondary | ICD-10-CM

## 2023-01-09 NOTE — Progress Notes (Signed)
Endocrinology Follow Up Note       01/09/2023, 8:18 AM   SUBJECTIVE: Tamara Terry is a 62 y.o.-year-old female, referred by her  Benita Stabile, MD  , for evaluation for hypercalcemia/hyperparathyroidism.   Past Medical History:  Diagnosis Date   Chronic back pain    Depression    GERD (gastroesophageal reflux disease)    Interstitial cystitis    Neuropathy of both feet    Obesity    Plantar fasciitis, bilateral     Past Surgical History:  Procedure Laterality Date   CHOLECYSTECTOMY  1990   CYSTO WITH HYDRODISTENSION  11/07/2011   Procedure: CYSTOSCOPY/HYDRODISTENSION;  Surgeon: Marcine Matar, MD;  Location: [facility];  Service: Urology;  Laterality: N/A;  PYRIDIUM AND MARCAINE   HYSTEROSCOPY WITH D & C  07-21-2004    Social History   Tobacco Use   Smoking status: Former    Current packs/day: 0.00    Types: Cigarettes    Quit date: 06/25/1994    Years since quitting: 28.5   Smokeless tobacco: Never  Vaping Use   Vaping status: Never Used  Substance Use Topics   Alcohol use: Yes   Drug use: No    Family History  Problem Relation Age of Onset   Heart disease Mother    Diabetes Mother    Hypertension Mother    Parkinson's disease Mother    Kidney disease Mother    Colon polyps Mother    Heart attack Father    Diabetes Sister    Diabetes Maternal Grandmother    Cancer Maternal Grandfather     Outpatient Encounter Medications as of 01/09/2023  Medication Sig   cetirizine (ZYRTEC) 10 MG tablet Take 10 mg by mouth daily.   Cholecalciferol (VITAMIN D-3 PO) Take by mouth.   Fe Fum-FePoly-FA-Vit C-Vit B3 (FOLIVANE-F) 125-1 MG CAPS Take by mouth.   meloxicam (MOBIC) 15 MG tablet Take 15 mg by mouth daily.   Multiple Vitamins-Minerals (MULTIVITAMIN GUMMIES ADULT) CHEW Chew by mouth daily.   omeprazole (PRILOSEC) 20 MG capsule TAKE ONE (1) CAPSULE BY MOUTH TWICE A DAY. (EVERY 12 HOURS.)    sertraline (ZOLOFT) 50 MG tablet Take 50 mg by mouth daily.   Facility-Administered Encounter Medications as of 01/09/2023  Medication   bupivacaine (MARCAINE) 0.5 % 15 mL, phenazopyridine (PYRIDIUM) 400 mg bladder mixture    Allergies  Allergen Reactions   Azithromycin    Latex      HPI  Tamara Terry was diagnosed with hypercalcemia in 2015 with highest calcium level of 11.2 in 2019.  Her PTH hormone has also been high since then as well.  She has never had any specialist care or further work up for this in the past.  Patient has no previously known history of parathyroid, pituitary, adrenal dysfunctions; no family history of such dysfunctions.  -Review of herreferral package of most recent labs reveals calcium of 10.8 the corresponding PTH of 93 on 09/13/22.  She has never had a DEXA scan.  No prior history of fragility fractures or falls. She thinks she had a kidney stone during the pandemic but was unable to seek care  due to restrictions.  No history of CKD. Last BUN/Cr: 12/0.54  she is not on HCTZ or other thiazide therapy.  No history of vitamin D deficiency. No records that vitamin D has been checked previously, she is on Vitamin D supplement with OTC vitamin d3 2000 units daily.  she is not on calcium supplements (aside from her womens MVI which she only recently started),  she eats dairy and green, leafy, vegetables in small amounts.  she does not have a family history of hypercalcemia, pituitary tumors, thyroid cancer, or osteoporosis.   I reviewed her chart and she also has a history of GERD, HLD, migraines, anxiety.    ROS:  Constitutional: no weight gain/loss, no fatigue, no subjective hyperthermia, no subjective hypothermia Eyes: no blurry vision, no xerophthalmia ENT: no sore throat, no nodules palpated in throat, no dysphagia/odynophagia, no hoarseness Cardiovascular: no Chest Pain, no Shortness of Breath, no palpitations, no leg swelling Respiratory: no cough,  no shortness of breath  Gastrointestinal: no Nausea/Vomiting/Diarrhea Musculoskeletal: no muscle/joint aches Skin: no rashes Neurological: no tremors, no numbness, no tingling, no dizziness Psychiatric: no depression, no anxiety  ------------------------------------------------------------------------------------------------------------------------------- OBJECTIVE: BP 107/70 (BP Location: Left Arm, Patient Position: Sitting, Cuff Size: Large)   Pulse (!) 56   Ht 5\' 5"  (1.651 m)   Wt 212 lb 12.8 oz (96.5 kg)   BMI 35.41 kg/m , Body mass index is 35.41 kg/m. Wt Readings from Last 3 Encounters:  01/09/23 212 lb 12.8 oz (96.5 kg)  11/02/22 210 lb 9.6 oz (95.5 kg)  08/16/22 215 lb (97.5 kg)    BP Readings from Last 3 Encounters:  01/09/23 107/70  11/02/22 118/76  08/16/22 117/73     Physical Exam- Limited  Constitutional:  Body mass index is 35.41 kg/m. , not in acute distress, normal state of mind Eyes:  EOMI, no exophthalmos Neck: Supple Cardiovascular: RRR, no murmurs, rubs, or gallops, no edema Respiratory: Adequate breathing efforts, no crackles, rales, rhonchi, or wheezing Musculoskeletal: no gross deformities, strength intact in all four extremities, no gross restriction of joint movements Skin:  no rashes, no hyperemia Neurological: no tremor with outstretched hands   CMP ( most recent) CMP     Component Value Date/Time   NA 140 11/02/2022 1108   K 4.4 11/02/2022 1108   CL 102 11/02/2022 1108   CO2 25 11/02/2022 1108   GLUCOSE 98 11/02/2022 1108   GLUCOSE 100 (H) 12/29/2021 1009   BUN 12 11/02/2022 1108   CREATININE 0.51 (L) 11/02/2022 1108   CREATININE 0.60 04/12/2014 1658   CALCIUM 11.1 (H) 11/02/2022 1108   PROT 7.2 11/02/2022 1108   ALBUMIN 4.8 11/02/2022 1108   AST 23 11/02/2022 1108   ALT 25 11/02/2022 1108   ALKPHOS 101 11/02/2022 1108   BILITOT 0.4 11/02/2022 1108   GFRNONAA >60 12/29/2021 1009   GFRAA >90 04/20/2011 1220     Diabetic Labs  (most recent): No results found for: "HGBA1C", "MICROALBUR"   Lipid Panel ( most recent) Lipid Panel     Component Value Date/Time   CHOL 211 (H) 04/12/2014 1658   TRIG 309 (H) 04/12/2014 1658   HDL 40 04/12/2014 1658   CHOLHDL 5.3 04/12/2014 1658   VLDL 62 (H) 04/12/2014 1658   LDLCALC 109 (H) 04/12/2014 1658      Lab Results  Component Value Date   TSH 2.350 11/02/2022   TSH 1.522 04/12/2014   FREET4 1.00 11/02/2022     Latest Reference Range & Units 11/02/22 11:08 11/05/22  05:13  COMPREHENSIVE METABOLIC PANEL  Rpt !   Sodium 134 - 144 mmol/L 140   Potassium 3.5 - 5.2 mmol/L 4.4   Chloride 96 - 106 mmol/L 102   CO2 20 - 29 mmol/L 25   Glucose 70 - 99 mg/dL 98   BUN 8 - 27 mg/dL 12   Creatinine 1.61 - 1.00 mg/dL 0.96 (L)   Calcium 8.7 - 10.3 mg/dL 04.5 (H)   BUN/Creatinine Ratio 12 - 28  24   eGFR >59 mL/min/1.73 106   Phosphorus 3.0 - 4.3 mg/dL 3.2   Magnesium 1.6 - 2.3 mg/dL 2.2   Alkaline Phosphatase 44 - 121 IU/L 101   Albumin 3.9 - 4.9 g/dL 4.8   Albumin/Globulin Ratio 1.2 - 2.2  2.0   AST 0 - 40 IU/L 23   ALT 0 - 32 IU/L 25   Total Protein 6.0 - 8.5 g/dL 7.2   Total Bilirubin 0.0 - 1.2 mg/dL 0.4   Vitamin D, 40-JWJXBJY 30.0 - 100.0 ng/mL 34.4   Globulin, Total 1.5 - 4.5 g/dL 2.4   PTH, Intact 15 - 65 pg/mL 78 (H)   PTH-related peptide pmol/L <2.0   PTH Interp  Comment   TSH 0.450 - 4.500 uIU/mL 2.350   T4,Free(Direct) 0.82 - 1.77 ng/dL 7.82   Creatinine, Urine Not Estab. mg/dL  95.6  Calcium, Urine Not Estab. mg/dL  21.3  Calcium, 08M Urine 0 - 320 mg/24 hr  171  Creatinine, 24H Ur 800 - 1,800 mg/24 hr  452 (L)  !: Data is abnormal (L): Data is abnormally low (H): Data is abnormally high Rpt: View report in Results Review for more information   Dexa scan from 11/28/22 Narrative & Impression  EXAM: DUAL X-RAY ABSORPTIOMETRY (DXA) FOR BONE MINERAL DENSITY   IMPRESSION: Your patient Gerarda Conklin completed a BMD test on 11/28/2022 using the [employer] DXA System (software version: 14.10) manufactured by [employer]. The following summarizes the results of our evaluation. Technologist: KRK PATIENT BIOGRAPHICAL: Name: Anderson, Coppock Patient ID: [id number] Birth Date: 01/13/1961 Height: 65.0 in. Gender: Female Exam Date: 11/28/2022 Weight: 210.6 lbs. Indications: Caucasian, Hyperparathyroid, Post Menopausal Fractures: Treatments: Vitamin D DENSITOMETRY RESULTS: Site         Region     Measured Date Measured Age WHO Classification Young Adult T-score BMD         %Change vs. Previous Significant Change (*) AP Spine L1-L2 11/28/2022 61.7 Osteopenia -2.0 0.928 g/cm2 - -   DualFemur Neck Left 11/28/2022 61.7 Osteopenia -1.8 0.785 g/cm2 - -   DualFemur Total Mean 11/28/2022 61.7 Osteopenia -1.2 0.854 g/cm2 - -   Left Forearm Radius 33% 11/28/2022 61.7 Osteoporosis -3.1 0.495 g/cm2 - - ASSESSMENT: The BMD measured at Forearm Radius 33% is 0.495 g/cm2 with a T-score of -3.1. This patient is considered osteoporotic according to World Health Organization [facility]) criteria. The scan quality is good. L3-4 was excluded due to advanced degenerative changes.   World Science writer [facility]) criteria for post-menopausal, Caucasian Women: Normal:       T-score at or above -1 SD Osteopenia:   T-score between -1 and -2.5 SD Osteoporosis: T-score at or below -2.5 SD   RECOMMENDATIONS: 1. All patients should optimize calcium and vitamin D intake. 2. Consider FDA-approved medical therapies in postmenopausal women and med aged 69 years and older, based on the following: a. A hip or vertebral (clinical or morphometric) fracture b. T-score< -2.5 at the femoral neck or spine after appropriate  evaluation to exclude secondary causes c. Low bone mass (T-score between -1.0 and -2.5 at the femoral neck or spine) and a 10-year probability of a hip fracture > 3% or a 10-year probability of a major osteoporosis-related fracture >  20% based on the [place]-adapted WHO algorithm d. Clinician judgment and/or patient preferences may indicate treatment for people with 10-year fracture probabilities above or below these levels   FOLLOW-UP: Patients with diagnosis of osteoporsis or at high risk for fracture should have regular bone mineral density tests. For patients eligible for Medicare, routine testing is allowed once every 2 years. The testing frequency can be increased to one year for patients who have rapidly progressing disease, those who are receiving or discontinuing medical therapy to restore bone mass, or have additional risk factors.   I have reviewed this report, and agree with the above findings.   [facility]     Electronically Signed   By: Baird Lyons M.D.   On: 11/28/2022 10:59     Assessment/Plan: 1. Hypercalcemia / Primary Hyperparathyroidism  Patient has had several instances of elevated calcium, with the highest level being at 11.2 mg/dL back in 1610.   - No apparent complications from hypercalcemia/hyperparathyroidism: no history of  nephrolithiasis,,fragility fractures. No abdominal pain, no major mood disorders, no bone pain.  - I discussed with the patient about the physiology of calcium and parathyroid hormone, and possible  effects of  increased PTH/ Calcium , including kidney stones, cardiac dysrhythmias, osteoporosis, abdominal pain, etc.   Her 24 hr urine specimen rules out FHH as possible cause FeCa score was [id number] (making FHH unlikely).  Calcium still elevated, along with PTH, still consistent with primary hyperparathyroidism.  She did have Dexa scan showing some mineral bone loss supporting this diagnosis.    She needs referral to Dr. Darnell Level for further evaluation to determine which parathyroid gland is affected and discuss possible treatment such as surgery.       I spent  20  minutes in the care of the patient today including review of labs from  paraThyroid Function, CMP, and other relevant labs ; imaging/biopsy records (current and previous including abstractions from other facilities); face-to-face time discussing  her lab results and symptoms, medications doses, her options of short and long term treatment based on the latest standards of care / guidelines;   and documenting the encounter.  Tamara Terry  participated in the discussions, expressed understanding, and voiced agreement with the above plans.  All questions were answered to her satisfaction. she is encouraged to contact clinic should she have any questions or concerns prior to her return visit.   FOLLOW UP PLAN: - Return follow up after consultation/treatment with Dr. Gerrit Friends.   Ronny Bacon, [facility] [address] [place], [place] [address] Phone: [phone] Fax: [phone]  01/09/2023, 8:18 AM

## 2023-02-04 DIAGNOSIS — S92302A Fracture of unspecified metatarsal bone(s), left foot, initial encounter for closed fracture: Secondary | ICD-10-CM | POA: Diagnosis not present

## 2023-02-26 DIAGNOSIS — M810 Age-related osteoporosis without current pathological fracture: Secondary | ICD-10-CM | POA: Diagnosis not present

## 2023-02-26 DIAGNOSIS — E210 Primary hyperparathyroidism: Secondary | ICD-10-CM | POA: Diagnosis not present

## 2023-02-27 ENCOUNTER — Other Ambulatory Visit (HOSPITAL_COMMUNITY): Payer: Self-pay | Admitting: Surgery

## 2023-02-27 DIAGNOSIS — E210 Primary hyperparathyroidism: Secondary | ICD-10-CM

## 2023-02-28 ENCOUNTER — Other Ambulatory Visit (HOSPITAL_COMMUNITY): Payer: Self-pay | Admitting: Surgery

## 2023-02-28 DIAGNOSIS — E210 Primary hyperparathyroidism: Secondary | ICD-10-CM

## 2023-03-07 ENCOUNTER — Ambulatory Visit (HOSPITAL_COMMUNITY): Payer: 59

## 2023-03-07 ENCOUNTER — Encounter (HOSPITAL_COMMUNITY): Payer: Self-pay

## 2023-07-01 ENCOUNTER — Emergency Department (HOSPITAL_COMMUNITY)
Admission: EM | Admit: 2023-07-01 | Discharge: 2023-07-01 | Disposition: A | Discharge status: Home / Self Care | Payer: 59 | Attending: Emergency Medicine | Admitting: Emergency Medicine

## 2023-07-01 ENCOUNTER — Emergency Department (HOSPITAL_COMMUNITY): Payer: 59

## 2023-07-01 ENCOUNTER — Encounter (HOSPITAL_COMMUNITY): Payer: Self-pay | Admitting: Emergency Medicine

## 2023-07-01 ENCOUNTER — Other Ambulatory Visit: Payer: Self-pay

## 2023-07-01 DIAGNOSIS — I959 Hypotension, unspecified: Secondary | ICD-10-CM | POA: Diagnosis not present

## 2023-07-01 DIAGNOSIS — R002 Palpitations: Secondary | ICD-10-CM

## 2023-07-01 DIAGNOSIS — I499 Cardiac arrhythmia, unspecified: Secondary | ICD-10-CM | POA: Diagnosis not present

## 2023-07-01 DIAGNOSIS — I471 Supraventricular tachycardia, unspecified: Secondary | ICD-10-CM | POA: Insufficient documentation

## 2023-07-01 DIAGNOSIS — R079 Chest pain, unspecified: Secondary | ICD-10-CM | POA: Diagnosis not present

## 2023-07-01 DIAGNOSIS — Z91040 Latex allergy status: Secondary | ICD-10-CM | POA: Insufficient documentation

## 2023-07-01 DIAGNOSIS — R000 Tachycardia, unspecified: Secondary | ICD-10-CM | POA: Diagnosis not present

## 2023-07-01 DIAGNOSIS — R231 Pallor: Secondary | ICD-10-CM | POA: Diagnosis not present

## 2023-07-01 DIAGNOSIS — Z743 Need for continuous supervision: Secondary | ICD-10-CM | POA: Diagnosis not present

## 2023-07-01 DIAGNOSIS — R55 Syncope and collapse: Secondary | ICD-10-CM | POA: Diagnosis not present

## 2023-07-01 LAB — TROPONIN I (HIGH SENSITIVITY)
Troponin I (High Sensitivity): 5 ng/L (ref <18)
Troponin I (High Sensitivity): 17 ng/L (ref <18)

## 2023-07-01 LAB — COMPREHENSIVE METABOLIC PANEL
ALT: 35 U/L (ref 0–44)
AST: 25 U/L (ref 15–41)
Albumin: 3.7 g/dL (ref 3.5–5.0)
Alkaline Phosphatase: 86 U/L (ref 38–126)
Anion gap: 4 — ABNORMAL LOW (ref 5–15)
BUN: 15 mg/dL (ref 8–23)
CO2: 27 mmol/L (ref 22–32)
Calcium: 9.8 mg/dL (ref 8.9–10.3)
Chloride: 112 mmol/L — ABNORMAL HIGH (ref 98–111)
Creatinine, Ser: 0.48 mg/dL (ref 0.44–1.00)
GFR, Estimated: >60 mL/min (ref >60)
Glucose, Bld: 90 mg/dL (ref 70–99)
Potassium: 3.9 mmol/L (ref 3.5–5.1)
Sodium: 143 mmol/L (ref 135–145)
Total Bilirubin: 0.8 mg/dL (ref 0.0–1.2)
Total Protein: 6.3 g/dL — ABNORMAL LOW (ref 6.5–8.1)

## 2023-07-01 LAB — CBC WITH DIFFERENTIAL/PLATELET
Abs Immature Granulocytes: 0.07 10*3/uL (ref 0.00–0.07)
Basophils Absolute: 0.1 10*3/uL (ref 0.0–0.1)
Basophils Relative: 1 %
Eosinophils Absolute: 0.2 10*3/uL (ref 0.0–0.5)
Eosinophils Relative: 2 %
HCT: 42.2 % (ref 36.0–46.0)
Hemoglobin: 14.2 g/dL (ref 12.0–15.0)
Immature Granulocytes: 1 %
Lymphocytes Relative: 28 %
Lymphs Abs: 3.1 10*3/uL (ref 0.7–4.0)
MCH: 31.8 pg (ref 26.0–34.0)
MCHC: 33.6 g/dL (ref 30.0–36.0)
MCV: 94.4 fL (ref 80.0–100.0)
Monocytes Absolute: 0.9 10*3/uL (ref 0.1–1.0)
Monocytes Relative: 8 %
Neutro Abs: 6.9 10*3/uL (ref 1.7–7.7)
Neutrophils Relative %: 60 %
Platelets: 227 10*3/uL (ref 150–400)
RBC: 4.47 MIL/uL (ref 3.87–5.11)
RDW: 11.4 % — ABNORMAL LOW (ref 11.5–15.5)
WBC: 11.2 10*3/uL — ABNORMAL HIGH (ref 4.0–10.5)
nRBC: 0 % (ref 0.0–0.2)

## 2023-07-01 LAB — TSH: TSH: 1.938 u[IU]/mL (ref 0.350–4.500)

## 2023-07-01 LAB — PHOSPHORUS: Phosphorus: 2.6 mg/dL (ref 2.5–4.6)

## 2023-07-01 LAB — MAGNESIUM: Magnesium: 1.9 mg/dL (ref 1.7–2.4)

## 2023-07-01 MED ORDER — SODIUM CHLORIDE 0.9 % IV BOLUS
1000.0000 mL | Freq: Once | INTRAVENOUS | Status: AC
  Administered 2023-07-01: 1000 mL via INTRAVENOUS

## 2023-07-01 NOTE — ED Triage Notes (Signed)
 Pt via EMS from work c/o central CP and palpitations since around 1430 this afternoon. Urgent Care found pt to be in SVT and called [facility]. EMS initiated 18ga IV in left AC and gave 6mg  adenosine which converted pt to NSR en route. Pt denies chest pain at time of arrival t5o ER but states that she still feels tired. A/O x 4 with mild headache. No SOB. EDP at bedside at time of triage.

## 2023-07-01 NOTE — Discharge Instructions (Signed)
 Your history, exam, and evaluation today were consistent with an episode of SVT causing the palpitations and fast heart rate.  We did other workup to look for evidence of cardiac injury or other electrolyte disturbances and your workup was otherwise reassuring and similar to prior.  We feel you are safe for discharge home after proving stability for over 5 hours here.  Please rest and stay hydrated and follow-up with your primary doctor.  If any symptoms change or worsen acutely, please return to the nearest emergency department.

## 2023-07-01 NOTE — ED Provider Notes (Signed)
  EMERGENCY DEPARTMENT AT [facility] Provider Note   CSN: [id number] Arrival date & time: 07/01/23  1642     History  No chief complaint on file.   Tamara Terry is a 63 y.o. female.  The history is provided by the patient, medical records and the EMS personnel. No language interpreter was used.  Palpitations Palpitations quality:  Fast Onset quality:  Sudden Timing:  Constant Associated symptoms: chest pain, near-syncope and shortness of breath   Associated symptoms: no back pain, no cough, no lower extremity edema, no nausea, no syncope, no vomiting and no weakness   Chest Pain Pain location:  Substernal area Pain quality: aching, pressure and tightness   Pain radiates to:  Does not radiate Pain severity:  Moderate Onset quality:  Sudden Duration:  2 hours Timing:  Constant Progression:  Resolved Chronicity:  New Relieved by: adenosine. Worsened by:  Nothing Associated symptoms: fatigue, near-syncope, palpitations and shortness of breath   Associated symptoms: no abdominal pain, no back pain, no cough, no fever, no headache, no lower extremity edema, no nausea, no syncope, no vomiting and no weakness        Home Medications Prior to Admission medications   Medication Sig Start Date End Date Taking? Authorizing Provider  cetirizine (ZYRTEC) 10 MG tablet Take 10 mg by mouth daily.    [provider]  Cholecalciferol (VITAMIN D -3 PO) Take by mouth.    [provider]  Fe Fum-FePoly-FA-Vit C-Vit B3 (FOLIVANE-F) 125-1 MG CAPS Take by mouth. 09/18/22   [provider]  meloxicam (MOBIC) 15 MG tablet Take 15 mg by mouth daily.    [provider]  Multiple Vitamins-Minerals (MULTIVITAMIN GUMMIES ADULT) CHEW Chew by mouth daily.    [provider]  omeprazole  (PRILOSEC) 20 MG capsule TAKE ONE (1) CAPSULE BY MOUTH TWICE A DAY. (EVERY 12 HOURS.) 08/17/22   Signa Nest A, NP  sertraline  (ZOLOFT ) 50 MG tablet  Take 50 mg by mouth daily. 09/30/19   [provider]      Allergies    Azithromycin and Latex    Review of Systems   Review of Systems  Constitutional:  Positive for fatigue. Negative for chills and fever.  HENT:  Negative for congestion.   Respiratory:  Positive for chest tightness and shortness of breath. Negative for cough.   Cardiovascular:  Positive for chest pain, palpitations and near-syncope. Negative for syncope.  Gastrointestinal:  Negative for abdominal pain, constipation, diarrhea, nausea and vomiting.  Genitourinary:  Negative for dysuria.  Musculoskeletal:  Negative for back pain, neck pain and neck stiffness.  Skin:  Negative for rash.  Neurological:  Positive for light-headedness. Negative for syncope, weakness and headaches.  Psychiatric/Behavioral:  Negative for agitation and confusion.   All other systems reviewed and are negative.   Physical Exam Updated Vital Signs There were no vitals taken for this visit. Physical Exam Vitals and nursing note reviewed.  Constitutional:      General: She is not in acute distress.    Appearance: She is well-developed. She is not ill-appearing, toxic-appearing or diaphoretic.  HENT:     Head: Normocephalic and atraumatic.     Nose: No congestion or rhinorrhea.     Mouth/Throat:     Mouth: Mucous membranes are dry.     Pharynx: No oropharyngeal exudate or posterior oropharyngeal erythema.  Eyes:     Extraocular Movements: Extraocular movements intact.     Conjunctiva/sclera: Conjunctivae normal.     Pupils:  Pupils are equal, round, and reactive to light.  Cardiovascular:     Rate and Rhythm: Normal rate and regular rhythm.     Pulses: Normal pulses.     Heart sounds: No murmur heard.    Comments: Now sinus and not tachy, EMS strip was SVT at 160s Pulmonary:     Effort: Pulmonary effort is normal. No respiratory distress.     Breath sounds: Normal breath sounds. No wheezing, rhonchi or rales.  Chest:     Chest  wall: No tenderness.  Abdominal:     General: Abdomen is flat.     Palpations: Abdomen is soft.     Tenderness: There is no abdominal tenderness.  Musculoskeletal:        General: No swelling.     Cervical back: Neck supple.     Right lower leg: No edema.     Left lower leg: No edema.  Skin:    General: Skin is warm and dry.     Capillary Refill: Capillary refill takes less than 2 seconds.     Findings: No erythema.  Neurological:     General: No focal deficit present.     Mental Status: She is alert.  Psychiatric:        Mood and Affect: Mood normal.     ED Results / Procedures / Treatments   Labs (all labs ordered are listed, but only abnormal results are displayed) Labs Reviewed  CBC WITH DIFFERENTIAL/PLATELET - Abnormal; Notable for the following components:      Result Value   WBC 11.2 (*)    RDW 11.4 (*)    All other components within normal limits  COMPREHENSIVE METABOLIC PANEL - Abnormal; Notable for the following components:   Chloride 112 (*)    Total Protein 6.3 (*)    Anion gap 4 (*)    All other components within normal limits  PHOSPHORUS  MAGNESIUM  TSH  PARATHYROID  HORMONE, INTACT (NO CA)  TROPONIN I (HIGH SENSITIVITY)  TROPONIN I (HIGH SENSITIVITY)    EKG EKG Interpretation Date/Time:  Monday July 01 2023 17:18:50 EST Ventricular Rate:  87 PR Interval:  154 QRS Duration:  93 QT Interval:  365 QTC Calculation: 440 R Axis:   -57  Text Interpretation: Sinus rhythm Left anterior fascicular block Abnormal R-wave progression, late transition when compared to prior, similar appearance No STEMI Confirmed by Ginger Barefoot ([id number]) on 07/01/2023 6:19:36 PM  Radiology DG Chest Portable 1 View Result Date: 07/01/2023 CLINICAL DATA:  Arrhythmia, chest pain, near syncope, shortness of breath. EXAM: PORTABLE CHEST 1 VIEW COMPARISON:  12/29/2021 and CT chest 12/29/2021. FINDINGS: Trachea is midline. Heart is at the upper limits of normal in size, stable.  Lungs are clear. No pleural fluid. IMPRESSION: No acute findings. Electronically Signed   By: Newell Eke M.D.   On: 07/01/2023 18:15    Procedures Procedures    Medications Ordered in ED Medications  sodium chloride  0.9 % bolus 1,000 mL (0 mLs Intravenous Stopped 07/01/23 1931)    ED Course/ Medical Decision Making/ A&P                                 Medical Decision Making Amount and/or Complexity of Data Reviewed Labs: ordered. Radiology: ordered.    Tamara Terry is a 63 y.o. female with a past medical history significant for GERD, depression, interstitial cystitis, previous cholecystectomy, and parathyroid  troubles with hypercalcemia scheduled  to have a parathyroidectomy sometime soon who presents with near syncope, shortness of breath, chest pain, and palpitations.  According to EMS and patient, about 2:30 PM she had onset of fast palpitations and chest tightness with shortness of breath.  She reports she did not pass out but was very near syncopal.  She was lightheaded.  She called for help and was found to have SVT with a rate in the 60s.  She was hypotensive with blood pressure in the 80s and ill-appearing.  EMS gave 6 of adenosine and she converted back to a sinus rhythm just prior to arrival to the emergency department.  She is starting to feel better now.  She does report that over the last 24 hours she has had fatigue that she could not attribute to anything.  She otherwise denied palpitations chest pain or shortness of breath before today.  She reports no nausea, vomiting, or constipation but is had some recent diarrhea.  She reports she takes supplementation for her electrolyte problems with the calcium and magnesium and she says she is scheduled to have her parathyroid  taken out sometime in the next month.  Due to reported insurance issues she did not have it done until the start of this year.  Otherwise, she denies increase in caffeine intake drug use or other medication  changes.  On exam, lungs clear.  Chest nontender.  Abdomen nontender.  No focal neurologic deficits.  Patient has good pulses in extremities.  I reviewed the EKG rhythm strip from EMS and that showed a heart rate in the 160s that appeared to SVT.  I saw the conversion strip and he went to sinus rhythm.  Patient now resting comfortably with rate in the 80s.  Blood pressure is over 110 and she is resting comfortably on room air.  Due to her history of electrolyte troubles we will get electrolytes and will also check some thyroid  and parathyroid  labs.  Will get chest x-ray and give her some fluids for hydration.  If she proves stability and workup reassuring, dissipate discharge home.  If there are concerning findings, anticipate determining disposition.  Patient did not have any further arrhythmias and workup was overall reassuring.  And your parathyroid  hormone was still in process and she can follow-up with that with her primary and surgical teams.  Otherwise her TSH and troponin was normal.  Mag and Phos normal.  We feel she is safe for discharge home for outpatient follow-up.  Patient agrees and was discharged in good condition after episode of SVTs night that resolved en route with EMS.         Final Clinical Impression(s) / ED Diagnoses Final diagnoses:  SVT (supraventricular tachycardia) (HCC)  Palpitations    Rx / DC Orders ED Discharge Orders     None      Clinical Impression: 1. SVT (supraventricular tachycardia) (HCC)   2. Palpitations     Disposition: Discharge  Condition: Good  I have discussed the results, Dx and Tx plan with the pt(& family if present). He/she/they expressed understanding and agree(s) with the plan. Discharge instructions discussed at great length. Strict return precautions discussed and pt &/or family have verbalized understanding of the instructions. No further questions at time of discharge.    Discharge Medication List as of 07/01/2023 10:30  PM      Follow Up: Shona Norleen PEDLAR, MD [address] [place] [address] [phone]     [facility] OF [place].  [facility] [address] [place]  [id number] [phone]       Jeanett Antonopoulos, Lonni PARAS, MD 07/01/23 [phone]

## 2023-07-02 LAB — PARATHYROID HORMONE, INTACT (NO CA): PTH: 60 pg/mL (ref 15–65)

## 2023-07-10 ENCOUNTER — Telehealth: Payer: Self-pay | Admitting: *Deleted

## 2023-07-10 NOTE — Progress Notes (Signed)
 Transition Care Management Unsuccessful Follow-up Telephone Call  Date of discharge and from where:  [facility]  07/01/2023  Attempts:  1st Attempt  Reason for unsuccessful TCM follow-up call:  Left voice message

## 2023-07-16 ENCOUNTER — Encounter (HOSPITAL_COMMUNITY)
Admission: RE | Admit: 2023-07-16 | Discharge: 2023-07-16 | Disposition: A | Discharge status: Home / Self Care | Payer: 59 | Source: Ambulatory Visit | Attending: Surgery | Admitting: Surgery

## 2023-07-16 ENCOUNTER — Ambulatory Visit (HOSPITAL_COMMUNITY)
Admission: RE | Admit: 2023-07-16 | Discharge: 2023-07-16 | Disposition: A | Discharge status: Home / Self Care | Payer: 59 | Source: Ambulatory Visit | Attending: Surgery | Admitting: Surgery

## 2023-07-16 DIAGNOSIS — E210 Primary hyperparathyroidism: Secondary | ICD-10-CM | POA: Diagnosis not present

## 2023-07-16 DIAGNOSIS — R221 Localized swelling, mass and lump, neck: Secondary | ICD-10-CM | POA: Diagnosis not present

## 2023-07-16 DIAGNOSIS — D351 Benign neoplasm of parathyroid gland: Secondary | ICD-10-CM | POA: Diagnosis not present

## 2023-07-16 DIAGNOSIS — E049 Nontoxic goiter, unspecified: Secondary | ICD-10-CM | POA: Diagnosis not present

## 2023-07-16 MED ORDER — TECHNETIUM TC 99M SESTAMIBI GENERIC - CARDIOLITE
25.0000 | Freq: Once | INTRAVENOUS | Status: AC | PRN
  Administered 2023-07-16: 25 via INTRAVENOUS

## 2023-07-18 ENCOUNTER — Ambulatory Visit: Payer: 59 | Attending: Cardiology | Admitting: Cardiology

## 2023-07-18 ENCOUNTER — Encounter: Payer: Self-pay | Admitting: Cardiology

## 2023-07-18 VITALS — BP 118/62 | HR 62 | Ht 65.0 in | Wt 220.2 lb

## 2023-07-18 DIAGNOSIS — R002 Palpitations: Secondary | ICD-10-CM

## 2023-07-18 DIAGNOSIS — I471 Supraventricular tachycardia, unspecified: Secondary | ICD-10-CM

## 2023-07-18 MED ORDER — METOPROLOL TARTRATE 25 MG PO TABS: 12.5000 mg | ORAL_TABLET | Freq: Two times a day (BID) | ORAL | 6 refills | Status: DC

## 2023-07-18 NOTE — Patient Instructions (Signed)
Medication Instructions:   Begin Lopressor 12.5mg  twice a day  - may take an extra 1/2 tab as needed for palpitations.  Continue all other medications.     Labwork:  none  Testing/Procedures:  none  Follow-Up:  4 months   Any Other Special Instructions Will Be Listed Below (If Applicable).   If you need a refill on your cardiac medications before your next appointment, please call your pharmacy.

## 2023-07-18 NOTE — Progress Notes (Signed)
Clinical Summary Tamara Terry is a 63 y.o.female seen today as a new consult for the following medical problems.  1.Palpitations - ER visit 07/01/23 with near syncope, SOB, chest pain, palpitations - per EMS found to be in SVT 160s, soft bp's. Given 6mg  of adenosine and converted to SR - K 3.9, Mg 1.9, TSH 1.9 - ED EKG SR, LAFB - EMS runs sheet includes EKGs, narrow complex regular tachy 150s  - around 230pm was sitting talking to employee, felt heart racing all of a sudden.  - went home to rest. Felt clammy and sweaty.  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     Cholecalciferol (VITAMIN D-3 PO) Take 1 tablet by mouth daily.     cyanocobalamin (VITAMIN B12) 1000 MCG/ML injection Inject 1,000 mcg into the muscle every 30 (thirty) days.     Fe Fum-FePoly-FA-Vit C-Vit B3 (FOLIVANE-F) 125-1 MG CAPS Take by mouth.     magnesium oxide (MAG-OX) 400 (240 Mg) MG tablet Take 400 mg by mouth daily.     meloxicam (MOBIC) 15 MG tablet Take 15 mg by mouth daily.     Multiple Vitamins-Minerals (MULTIVITAMIN GUMMIES ADULT) CHEW Chew by mouth daily.     omeprazole (PRILOSEC) 20 MG capsule TAKE ONE (1) CAPSULE BY MOUTH TWICE A DAY. (EVERY 12 HOURS.) 60 capsule 11   sertraline (ZOLOFT) 50 MG tablet Take 50 mg by mouth daily.     No current facility-administered medications for this visit.   Facility-Administered Medications Ordered in Other Visits   Medication Dose Route Frequency Provider Last Rate Last Admin   bupivacaine (MARCAINE) 0.5 % 15 mL, phenazopyridine (PYRIDIUM) 400 mg bladder mixture   Bladder Instillation Once Tamara Matar, MD         Past Surgical History:  Procedure Laterality Date   CHOLECYSTECTOMY  1990   CYSTO WITH HYDRODISTENSION  11/07/2011   Procedure: CYSTOSCOPY/HYDRODISTENSION;  Surgeon: Tamara Matar, MD;  Location[facility];  Service: Urology;  Laterality: N/A;  PYRIDIUM AND MARCAINE   HYSTEROSCOPY WITH D & C  07-21-2004     Allergies  Allergen Reactions   Azithromycin    Latex       Family History  Problem Relation Age of Onset   Heart disease Mother    Diabetes Mother    Hypertension Mother    Parkinson's disease Mother    Kidney disease Mother    Colon polyps Mother    Heart attack Father    Diabetes Sister    Diabetes Maternal Grandmother    Cancer Maternal Grandfather      Social History Ms. Vivar reports that she quit smoking about 29 years ago. Her smoking use included cigarettes. She has never used smokeless tobacco. Ms. Gulbrandson reports current alcohol use.   Review of Systems CONSTITUTIONAL: No weight  loss, fever, chills, weakness or fatigue.  HEENT: Eyes: No visual loss, blurred vision, double vision or yellow sclerae.No hearing loss, sneezing, congestion, runny nose or sore throat.  SKIN: No rash or itching.  CARDIOVASCULAR: per hpi RESPIRATORY: No shortness of breath, cough or sputum.  GASTROINTESTINAL: No anorexia, nausea, vomiting or diarrhea. No abdominal pain or blood.  GENITOURINARY: No burning on urination, no polyuria NEUROLOGICAL: No headache, dizziness, syncope, paralysis, ataxia, numbness or tingling in the extremities. No change in bowel or bladder control.  MUSCULOSKELETAL: No muscle, back pain, joint pain or stiffness.  LYMPHATICS: No enlarged nodes. No history of splenectomy.  PSYCHIATRIC: No history of depression or anxiety.   ENDOCRINOLOGIC: No reports of sweating, cold or heat intolerance. No polyuria or polydipsia.  Marland Kitchen   Physical Examination Today's Vitals   07/18/23 0844  BP: 118/62  Pulse: 62  SpO2: 95%  Weight: 220 lb 3.2 oz (99.9 kg)  Height: 5\' 5"  (1.651 m)   Body mass index is 36.64 kg/m.  Gen: resting comfortably, no acute distress HEENT: no scleral icterus, pupils equal round and reactive, no palptable cervical adenopathy,  CV: RRR, no mrg, no jvd Resp: Clear to auscultation bilaterally GI: abdomen is soft, non-tender, non-distended, normal bowel sounds, no hepatosplenomegaly MSK: extremities are warm, no edema.  Skin: warm, no rash Neuro:  no focal deficits Psych: appropriate affect     Assessment and Plan  1.PSVT/palpitations - isolated episode, resolved with adenosine. Capture in the EMS runs sheet in the media section fo epic - will see if can tolerate low dose lopressor 12.5mg  bid, can take additional 12.5mg  prn. Discussed vagal maneuvers. Low baseline heart rate, would be limited on av nodal agent dosing. If reoccuring issue would need to consider EP referral and possible ablation   F/u 4 months [place] office.       Antoine Poche, M.D.

## 2023-07-19 DIAGNOSIS — E782 Mixed hyperlipidemia: Secondary | ICD-10-CM | POA: Diagnosis not present

## 2023-07-19 DIAGNOSIS — D509 Iron deficiency anemia, unspecified: Secondary | ICD-10-CM | POA: Diagnosis not present

## 2023-07-23 ENCOUNTER — Encounter: Payer: Self-pay | Admitting: Surgery

## 2023-07-23 ENCOUNTER — Ambulatory Visit: Payer: Self-pay | Admitting: Surgery

## 2023-07-23 NOTE — Progress Notes (Signed)
Both the nuclear scan and the USN point to a left inferior parathyroid adenoma.  Will plan to proceed with minimally invasive outpatient parathyroid surgery as we discussed at the office.  I will enter orders and send to schedulers to contact the patient.  Darnell Level, MD [facility] practice Office: [phone]

## 2023-07-25 ENCOUNTER — Telehealth: Payer: Self-pay | Admitting: Cardiology

## 2023-07-25 NOTE — Telephone Encounter (Signed)
Pt c/o medication issue:  1. Name of Medication:   metoprolol tartrate (LOPRESSOR) 25 MG tablet   2. How are you currently taking this medication (dosage and times per day)?   As prescribed  3. Are you having a reaction (difficulty breathing--STAT)?   Tiredness, dizziness,  diarrhea  4. What is your medication issue?   Patient stated she is not tolerating this medication and wants alternate medication.  Patient stated she has not taken this medication since Tuesday.

## 2023-07-25 NOTE — Telephone Encounter (Signed)
Left message to return call OR may reply via mychart.

## 2023-07-26 ENCOUNTER — Encounter: Payer: Self-pay | Admitting: *Deleted

## 2023-07-26 NOTE — Telephone Encounter (Signed)
Will reply to patient via mychart.

## 2023-08-01 NOTE — Progress Notes (Addendum)
 Patient is not feeling well today. Completed interview over the phone. She is coming in for labs 08/13/23 at 1000. Will give instructions and soap at that time.  Date of COVID positive in last 90 days: no  PCP - Norleen Hurst, MD Cardiologist - Dorn Ross, MD LOV 07/18/23  Chest x-ray - 07/01/23 Epic EKG - 07/08/23 Epic Stress Test - n/a ECHO - n/a Cardiac Cath - n/a Pacemaker/ICD device last checked: n/a Spinal Cord Stimulator: n/a  Bowel Prep - no  Sleep Study - n/a CPAP -   Fasting Blood Sugar - n/a Checks Blood Sugar _____ times a day  Last dose of GLP1 agonist-  N/A GLP1 instructions:  Hold 7 days before surgery    Last dose of SGLT-2 inhibitors-  N/A SGLT-2 instructions:  Hold 3 days before surgery    Blood Thinner Instructions: n/a Aspirin Instructions: Last Dose:  Activity level: Can go up a flight of stairs and perform activities of daily living without stopping and without symptoms of chest pain or shortness of breath.  Anesthesia review: palpitations ,SVT, near syncope  Patient denies shortness of breath, fever, cough and chest pain at PAT appointment  Patient verbalized understanding of instructions that were given to them at the PAT appointment. Patient was also instructed that they will need to review over the PAT instructions again at home before surgery.

## 2023-08-01 NOTE — Patient Instructions (Addendum)
 SURGICAL WAITING ROOM VISITATION  Patients having surgery or a procedure may have no more than 2 support people in the waiting area - these visitors may rotate.    Children under the age of 22 must have an adult with them who is not the patient.  Due to an increase in RSV and influenza rates and associated hospitalizations, children ages 63 and under may not visit patients in [facility] hospitals.  Visitors with respiratory illnesses are discouraged from visiting and should remain at home.  If the patient needs to stay at the hospital during part of their recovery, the visitor guidelines for inpatient rooms apply. Pre-op nurse will coordinate an appropriate time for 1 support person to accompany patient in pre-op.  This support person may not rotate.    Please refer to the [facility] website for the visitor guidelines for Inpatients (after your surgery is over and you are in a regular room).    Your procedure is scheduled on: 08/22/23   Report to [facility] Main Entrance    Report to admitting at 5:15 AM   Call this number if you have problems the morning of surgery [phone]   Do not eat food :After Midnight.   After Midnight you may have the following liquids until 4:30 AM DAY OF SURGERY  Water  Non-Citrus Juices (without pulp, NO RED-Apple, White grape, White cranberry) Black Coffee (NO MILK/CREAM OR CREAMERS, sugar ok)  Clear Tea (NO MILK/CREAM OR CREAMERS, sugar ok) regular and decaf                             Plain Jell-O (NO RED)                                           Fruit ices (not with fruit pulp, NO RED)                                     Popsicles (NO RED)                                                               Sports drinks like Gatorade (NO RED)                      If you have questions, please contact your surgeon's office.   FOLLOW BOWEL PREP AND ANY ADDITIONAL PRE OP INSTRUCTIONS YOU RECEIVED FROM YOUR SURGEON'S OFFICE!!!     Oral  Hygiene is also important to reduce your risk of infection.                                    Remember - BRUSH YOUR TEETH THE MORNING OF SURGERY WITH YOUR REGULAR TOOTHPASTE  DENTURES WILL BE REMOVED PRIOR TO SURGERY PLEASE DO NOT APPLY Poly grip OR ADHESIVES!!!   Stop all vitamins and herbal supplements 7 days before surgery.   Take these medicines the morning of surgery with A SIP OF WATER : Zyrtec,  Omeprazole , Sertraline , Metoprolol                                You may not have any metal on your body including hair pins, jewelry, and body piercing             Do not wear make-up, lotions, powders, perfumes, or deodorant  Do not wear nail polish including gel and S&S, artificial/acrylic nails, or any other type of covering on natural nails including finger and toenails. If you have artificial nails, gel coating, etc. that needs to be removed by a nail salon please have this removed prior to surgery or surgery may need to be canceled/ delayed if the surgeon/ anesthesia feels like they are unable to be safely monitored.   Do not shave  48 hours prior to surgery.    Do not bring valuables to the hospital. [facility] IS NOT             RESPONSIBLE   FOR VALUABLES.   Contacts, glasses, dentures or bridgework may not be worn into surgery.  DO NOT BRING YOUR HOME MEDICATIONS TO THE HOSPITAL. PHARMACY WILL DISPENSE MEDICATIONS LISTED ON YOUR MEDICATION LIST TO YOU DURING YOUR ADMISSION IN THE HOSPITAL!    Patients discharged on the day of surgery will not be allowed to drive home.  Someone NEEDS to stay with you for the first 24 hours after anesthesia.   Special Instructions: Bring a copy of your healthcare power of attorney and living will documents the day of surgery if you haven't scanned them before.              Please read over the following fact sheets you were given: IF YOU HAVE QUESTIONS ABOUT YOUR PRE-OP INSTRUCTIONS PLEASE CALL [phone]    If you received a COVID  test during your pre-op visit  it is requested that you wear a mask when out in public, stay away from anyone that may not be feeling well and notify your surgeon if you develop symptoms. If you test positive for Covid or have been in contact with anyone that has tested positive in the last 10 days please notify you surgeon.    [facility] - Preparing for Surgery Before surgery, you can play an important role.  Because skin is not sterile, your skin needs to be as free of germs as possible.  You can reduce the number of germs on your skin by washing with CHG (chlorahexidine gluconate) soap before surgery.  CHG is an antiseptic cleaner which kills germs and bonds with the skin to continue killing germs even after washing. Please DO NOT use if you have an allergy to CHG or antibacterial soaps.  If your skin becomes reddened/irritated stop using the CHG and inform your nurse when you arrive at Short Stay. Do not shave (including legs and underarms) for at least 48 hours prior to the first CHG shower.  You may shave your face/neck.  Please follow these instructions carefully:  1.  Shower with CHG Soap the night before surgery and the  morning of surgery.  2.  If you choose to wash your hair, wash your hair first as usual with your normal  shampoo.  3.  After you shampoo, rinse your hair and body thoroughly to remove the shampoo.  4.  Use CHG as you would any other liquid soap.  You can apply chg directly to the skin and wash.  Gently with a scrungie or clean washcloth.  5.  Apply the CHG Soap to your body ONLY FROM THE NECK DOWN.   Do   not use on face/ open                           Wound or open sores. Avoid contact with eyes, ears mouth and   genitals (private parts).                       Wash face,  Genitals (private parts) with your normal soap.             6.  Wash thoroughly, paying special attention to the area where your    surgery  will be performed.  7.  Thoroughly  rinse your body with warm water  from the neck down.  8.  DO NOT shower/wash with your normal soap after using and rinsing off the CHG Soap.                9.  Pat yourself dry with a clean towel.            10.  Wear clean pajamas.            11.  Place clean sheets on your bed the night of your first shower and do not  sleep with pets. Day of Surgery : Do not apply any lotions/deodorants the morning of surgery.  Please wear clean clothes to the hospital/surgery center.  FAILURE TO FOLLOW THESE INSTRUCTIONS MAY RESULT IN THE CANCELLATION OF YOUR SURGERY  PATIENT SIGNATURE_________________________________  NURSE SIGNATURE__________________________________  ________________________________________________________________________

## 2023-08-02 ENCOUNTER — Encounter (HOSPITAL_COMMUNITY)
Admission: RE | Admit: 2023-08-02 | Discharge: 2023-08-02 | Disposition: A | Discharge status: Home / Self Care | Payer: 59 | Source: Ambulatory Visit | Attending: Surgery | Admitting: Surgery

## 2023-08-02 ENCOUNTER — Other Ambulatory Visit: Payer: Self-pay

## 2023-08-02 ENCOUNTER — Encounter (HOSPITAL_COMMUNITY): Payer: Self-pay

## 2023-08-02 DIAGNOSIS — I2510 Atherosclerotic heart disease of native coronary artery without angina pectoris: Secondary | ICD-10-CM

## 2023-08-02 HISTORY — DX: Supraventricular tachycardia, unspecified: I47.10

## 2023-08-02 HISTORY — DX: Unspecified osteoarthritis, unspecified site: M19.90

## 2023-08-13 ENCOUNTER — Encounter (HOSPITAL_COMMUNITY)
Admission: RE | Admit: 2023-08-13 | Discharge: 2023-08-13 | Disposition: A | Discharge status: Home / Self Care | Payer: 59 | Source: Ambulatory Visit | Attending: Surgery | Admitting: Surgery

## 2023-08-13 DIAGNOSIS — Z01812 Encounter for preprocedural laboratory examination: Secondary | ICD-10-CM | POA: Insufficient documentation

## 2023-08-13 DIAGNOSIS — Z01818 Encounter for other preprocedural examination: Secondary | ICD-10-CM | POA: Diagnosis not present

## 2023-08-13 DIAGNOSIS — I2510 Atherosclerotic heart disease of native coronary artery without angina pectoris: Secondary | ICD-10-CM | POA: Insufficient documentation

## 2023-08-13 LAB — BASIC METABOLIC PANEL
Anion gap: 10 (ref 5–15)
BUN: 14 mg/dL (ref 8–23)
CO2: 27 mmol/L (ref 22–32)
Calcium: 10.7 mg/dL — ABNORMAL HIGH (ref 8.9–10.3)
Chloride: 104 mmol/L (ref 98–111)
Creatinine, Ser: 0.46 mg/dL (ref 0.44–1.00)
GFR, Estimated: >60 mL/min (ref >60)
Glucose, Bld: 103 mg/dL — ABNORMAL HIGH (ref 70–99)
Potassium: 4 mmol/L (ref 3.5–5.1)
Sodium: 141 mmol/L (ref 135–145)

## 2023-08-13 LAB — CBC
HCT: 45.6 % (ref 36.0–46.0)
Hemoglobin: 14.8 g/dL (ref 12.0–15.0)
MCH: 31.5 pg (ref 26.0–34.0)
MCHC: 32.5 g/dL (ref 30.0–36.0)
MCV: 97 fL (ref 80.0–100.0)
Platelets: 245 10*3/uL (ref 150–400)
RBC: 4.7 MIL/uL (ref 3.87–5.11)
RDW: 11.9 % (ref 11.5–15.5)
WBC: 8.5 10*3/uL (ref 4.0–10.5)
nRBC: 0 % (ref 0.0–0.2)

## 2023-08-14 NOTE — Progress Notes (Signed)
Anesthesia Chart Review   Case: [id number] Date/Time: 08/22/23 0715   Procedure: LEFT INFERIOR PARATHYROIDECTOMY (Left)   Anesthesia type: General   Pre-op diagnosis: PRIMARY HYPEPARATHYROIDISM   Location[facility] ROOM 01 / [facility]   Surgeons: Darnell Level, MD       DISCUSSION:62 y.o. former smoker with h/o SVT, primary hyperparathyroidism scheduled for above procedure 08/22/2023 with Dr. Darnell Level.   Pt seen by cardiology 07/18/2023 for evaluation of palpitations.  Per notes isolated episode of PSVT which resolved with adenosine. Troponins negative. Pt started on lopressor and has 4 month follow up.   VS: There were no vitals taken for this visit.  PROVIDERS: Benita Stabile, MD is PCP   Dina Rich, MD is Cardiologist  LABS: Labs reviewed: Acceptable for surgery. (all labs ordered are listed, but only abnormal results are displayed)  Labs Reviewed - No data to display   IMAGES:   EKG:   CV:  Past Medical History:  Diagnosis Date   Arthritis    Chronic back pain    Depression    GERD (gastroesophageal reflux disease)    Interstitial cystitis    Neuropathy of both feet    Obesity    Plantar fasciitis, bilateral    SVT (supraventricular tachycardia) (HCC)     Past Surgical History:  Procedure Laterality Date   CHOLECYSTECTOMY  06/25/1988   CYSTO WITH HYDRODISTENSION  11/07/2011   Procedure: CYSTOSCOPY/HYDRODISTENSION;  Surgeon: Marcine Matar, MD;  Location: [facility];  Service: Urology;  Laterality: N/A;  PYRIDIUM AND MARCAINE   HYSTEROSCOPY WITH D & C  07/21/2004   MASTECTOMY      MEDICATIONS: No current facility-administered medications for this encounter.    Biotin w/ Vitamins C & E (HAIR SKIN & NAILS GUMMIES PO)   cetirizine (ZYRTEC) 10 MG tablet   Cholecalciferol (VITAMIN D-3 PO)   Cyanocobalamin (B-12) 5000 MCG CAPS   FERROUS SULFATE PO   Magnesium Oxide -Mg Supplement 250 MG TABS   meloxicam (MOBIC) 15 MG tablet   omeprazole  (PRILOSEC) 20 MG capsule   sertraline (ZOLOFT) 50 MG tablet   metoprolol tartrate (LOPRESSOR) 25 MG tablet    bupivacaine (MARCAINE) 0.5 % 15 mL, phenazopyridine (PYRIDIUM) 400 mg bladder mixture    Jodell Cipro Ward, PA-C [facility] Pre-Surgical Testing [phone]

## 2023-08-15 ENCOUNTER — Encounter (HOSPITAL_COMMUNITY): Payer: Self-pay | Admitting: Surgery

## 2023-08-15 DIAGNOSIS — E210 Primary hyperparathyroidism: Secondary | ICD-10-CM | POA: Diagnosis present

## 2023-08-15 NOTE — H&P (Signed)
REFERRING PHYSICIAN: Dani Gobble, NP  PROVIDER: Alta Goding Myra Rude, MD   Chief Complaint: New Consultation (hyperparathyroidism)  History of Present Illness:  Patient is referred by Ronny Bacon, NP, for surgical evaluation and management of primary hyperparathyroidism. Had been noted to have hypercalcemia dating back as long as 71. Recent calcium level is elevated at 11.1 with an elevated intact PTH level of 78. 24-hour urine collection for calcium was normal at 171. 25-hydroxy vitamin D level was normal at 34.4. Patient has not had any imaging studies performed. Patient does have complications including a bone density scan demonstrating osteoporosis. Patient notes significant fatigue. She denies any recent fractures. She denies nephrolithiasis. She denies bone and joint pain. There is no family history of parathyroid disease or other endocrine neoplasms. Patient is accompanied by her husband. 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  Review of Systems: A complete review of systems was obtained from the patient. I have reviewed this information and discussed as appropriate with the patient. See HPI as well for other ROS.  Review of Systems  Constitutional: Positive for malaise/fatigue.  HENT: Negative.  Eyes: Negative.  Respiratory: Negative.  Cardiovascular: Negative.  Gastrointestinal: Negative.  Genitourinary: Negative.  Musculoskeletal: Negative. Negative for joint pain.  Skin: Negative.  Neurological: Negative. Negative for tremors.  Endo/Heme/Allergies: Negative.  Psychiatric/Behavioral: Positive for memory loss.    Medical History: Past Medical History:  Diagnosis Date  Arthritis  GERD (gastroesophageal reflux disease)  Thyroid disease   Patient Active Problem List  Diagnosis  Primary hyperparathyroidism (CMS/HHS-HCC)  Age-related osteoporosis without current pathological fracture   Past Surgical History:  Procedure  Laterality Date  CHOLECYSTECTOMY  HYSTERECTOMY    Allergies  Allergen Reactions  Azithromycin Other (See Comments)  Latex Other (See Comments)   Current Outpatient Medications on File Prior to Visit  Medication Sig Dispense Refill  cetirizine (ZYRTEC) 10 MG tablet Take 10 mg by mouth once daily  cyanocobalamin (VITAMIN B12) 1,000 mcg/mL injection Inject into the muscle monthly  FOLIVANE-F 125-1-40-3 mg Cap Take by mouth  meloxicam (MOBIC) 15 MG tablet Take 15 mg by mouth once daily  omeprazole (PRILOSEC) 20 MG DR capsule TAKE ONE (1) CAPSULE BY MOUTH TWICE A DAY. (EVERY 12 HOURS.)  sertraline (ZOLOFT) 50 MG tablet   No current facility-administered medications on file prior to visit.   Family History  Problem Relation Age of Onset  Obesity Mother  Diabetes Mother  Colon cancer Mother  Obesity Father  Coronary Artery Disease (Blocked arteries around heart) Father  Obesity Sister  Diabetes Sister  Diabetes Brother    Social History   Tobacco Use  Smoking Status Former  Types: Cigarettes  Start date: 1996  Smokeless Tobacco Never    Social History   Socioeconomic History  Marital status: Married  Tobacco Use  Smoking status: Former  Types: Cigarettes  Start date: 1996  Smokeless tobacco: Never  Substance and Sexual Activity  Alcohol use: Never  Drug use: Never   Social Determinants of Corporate investment banker Strain: Low Risk (08/16/2022)  Received from [facility]  Overall Financial Resource Strain (CARDIA)  Difficulty of Paying Living Expenses: Not hard at all  Food Insecurity: No Food Insecurity (08/16/2022)  Received from [facility]  Hunger Vital Sign  Worried About Running Out of Food in the Last Year: Never true  Ran Out of Food in the Last Year: Never true  Transportation Needs: No Transportation Needs (08/16/2022)  Received from [facility]  Health  PRAPARE - Risk analyst (Medical): No  Lack of Transportation (Non-Medical):  No  Physical Activity: Insufficiently Active (08/16/2022)  Received from [facility]  Exercise Vital Sign  Days of Exercise per Week: 3 days  Minutes of Exercise per Session: 30 min  Stress: No Stress Concern Present (08/16/2022)  Received from [facility] of Occupational Health - Occupational Stress Questionnaire  Feeling of Stress : Not at all  Social Connections: Moderately Integrated (08/16/2022)  Received from [facility]  Social Connection and Isolation Panel [NHANES]  Frequency of Communication with Friends and Family: Twice a week  Frequency of Social Gatherings with Friends and Family: More than three times a week  Attends Religious Services: More than 4 times per year  Active Member of [employer] or Organizations: No  Attends Engineer, structural: Never  Marital Status: Married   Objective:   Vitals:  BP: 138/80  Pulse: 78  Temp: 36.5 C (97.7 F)  SpO2: 93%  Weight: 99.6 kg (219 lb 9.6 oz)  Height: 165.1 cm (5\' 5" )  PainSc: 0-No pain   Body mass index is 36.54 kg/m.  Physical Exam   GENERAL APPEARANCE Comfortable, no acute issues Development: normal Gross deformities: none  SKIN Rash, lesions, ulcers: none Induration, erythema: none Nodules: none palpable  EYES Conjunctiva and lids: normal Pupils: equal and reactive  EARS, NOSE, MOUTH, THROAT External ears: no lesion or deformity External nose: no lesion or deformity Hearing: grossly normal  NECK Symmetric: yes Trachea: midline Thyroid: no palpable nodules in the thyroid bed  ABDOMEN Not assessed  GENITOURINARY/RECTAL Not assessed  MUSCULOSKELETAL Station and gait: normal Digits and nails: no clubbing or cyanosis Muscle strength: grossly normal all extremities Range of motion: grossly normal all extremities Deformity: none  LYMPHATIC Cervical: none palpable Supraclavicular: none palpable  PSYCHIATRIC Oriented to person, place, and time: yes Mood and affect:  normal for situation Judgment and insight: appropriate for situation   Assessment and Plan:   Primary hyperparathyroidism (CMS/HHS-HCC) Age-related osteoporosis without current pathological fracture  Patient is referred by her endocrinologist for surgical evaluation and management of suspected primary hyperparathyroidism.  Patient provided with a copy of "Parathyroid Surgery: Treatment for Your Parathyroid Gland Problem", published by [employer], 12 pages. Book reviewed and explained to patient during visit today.  Today we reviewed her clinical history. We reviewed her recent laboratory studies. I reviewed her bone density scan results. I would like to proceed with further evaluation to include an ultrasound examination of the neck and a nuclear medicine parathyroid scan with sestamibi. If these confirm the diagnosis and localize the parathyroid adenoma, then I believe she will be a good candidate for minimally invasive outpatient surgery. We discussed the procedure today. We discussed the size and location of the surgical incision. We discussed the hospital stay as well as the postoperative recovery. If the studies failed to identify the adenoma, then we will request a 4D CT scan of the neck in hopes of confirming the location of the parathyroid adenoma.  Patient and her husband understand. Questions are answered. We will make arrangements for the studies in the near future and I will contact them with the results when they are available.   Darnell Level, MD [facility] practice Office: [phone]

## 2023-08-21 NOTE — Anesthesia Preprocedure Evaluation (Signed)
 Anesthesia Evaluation  Patient identified by MRN, date of birth, ID band Patient awake    Reviewed: Allergy & Precautions, H&P , NPO status , Patient's Chart, lab work & pertinent test results  Airway Mallampati: II  TM Distance: >3 FB Neck ROM: Full    Dental no notable dental hx.    Pulmonary former smoker   Pulmonary exam normal breath sounds clear to auscultation       Cardiovascular negative cardio ROS Normal cardiovascular exam Rhythm:Regular Rate:Normal  Hx of SVT   Neuro/Psych  PSYCHIATRIC DISORDERS  Depression       GI/Hepatic Neg liver ROS,GERD  ,,  Endo/Other  negative endocrine ROS  hyperparathyrodism  Renal/GU negative Renal ROS  negative genitourinary   Musculoskeletal  (+) Arthritis ,    Abdominal   Peds negative pediatric ROS (+)  Hematology negative hematology ROS (+)   Anesthesia Other Findings   Reproductive/Obstetrics negative OB ROS                             Anesthesia Physical Anesthesia Plan  ASA: 3  Anesthesia Plan: General   Post-op Pain Management: Tylenol PO (pre-op)*   Induction: Intravenous  PONV Risk Score and Plan: 3 and Ondansetron, Dexamethasone, Midazolam and Treatment may vary due to age or medical condition  Airway Management Planned: Oral ETT  Additional Equipment:   Intra-op Plan:   Post-operative Plan: Extubation in OR  Informed Consent: I have reviewed the patients History and Physical, chart, labs and discussed the procedure including the risks, benefits and alternatives for the proposed anesthesia with the patient or authorized representative who has indicated his/her understanding and acceptance.     Dental advisory given  Plan Discussed with: CRNA  Anesthesia Plan Comments:        Anesthesia Quick Evaluation

## 2023-08-22 ENCOUNTER — Ambulatory Visit (HOSPITAL_COMMUNITY)
Admission: RE | Admit: 2023-08-22 | Discharge: 2023-08-23 | Disposition: A | Discharge status: Home / Self Care | Payer: 59 | Source: Ambulatory Visit | Attending: Surgery | Admitting: Surgery

## 2023-08-22 ENCOUNTER — Encounter (HOSPITAL_COMMUNITY): Payer: Self-pay | Admitting: Surgery

## 2023-08-22 ENCOUNTER — Ambulatory Visit (HOSPITAL_COMMUNITY): Payer: 59 | Admitting: Physician Assistant

## 2023-08-22 ENCOUNTER — Encounter (HOSPITAL_COMMUNITY)
Admission: RE | Disposition: A | Discharge status: Home / Self Care | Payer: Self-pay | Source: Ambulatory Visit | Attending: Surgery

## 2023-08-22 ENCOUNTER — Other Ambulatory Visit: Payer: Self-pay

## 2023-08-22 ENCOUNTER — Ambulatory Visit (HOSPITAL_BASED_OUTPATIENT_CLINIC_OR_DEPARTMENT_OTHER): Payer: 59 | Admitting: Physician Assistant

## 2023-08-22 DIAGNOSIS — E210 Primary hyperparathyroidism: Secondary | ICD-10-CM | POA: Diagnosis present

## 2023-08-22 DIAGNOSIS — K219 Gastro-esophageal reflux disease without esophagitis: Secondary | ICD-10-CM | POA: Insufficient documentation

## 2023-08-22 DIAGNOSIS — Z87891 Personal history of nicotine dependence: Secondary | ICD-10-CM | POA: Insufficient documentation

## 2023-08-22 DIAGNOSIS — F32A Depression, unspecified: Secondary | ICD-10-CM | POA: Insufficient documentation

## 2023-08-22 DIAGNOSIS — M810 Age-related osteoporosis without current pathological fracture: Secondary | ICD-10-CM | POA: Diagnosis not present

## 2023-08-22 DIAGNOSIS — M1990 Unspecified osteoarthritis, unspecified site: Secondary | ICD-10-CM | POA: Insufficient documentation

## 2023-08-22 DIAGNOSIS — E669 Obesity, unspecified: Secondary | ICD-10-CM | POA: Diagnosis not present

## 2023-08-22 HISTORY — PX: PARATHYROIDECTOMY: SHX19

## 2023-08-22 SURGERY — PARATHYROIDECTOMY
Anesthesia: General | Laterality: Left

## 2023-08-22 MED ORDER — EPHEDRINE SULFATE-NACL 50-0.9 MG/10ML-% IV SOSY
PREFILLED_SYRINGE | INTRAVENOUS | Status: DC | PRN
Start: 2023-08-22 — End: 2023-08-22
  Administered 2023-08-22: 5 mg via INTRAVENOUS
  Administered 2023-08-22 (×2): 10 mg via INTRAVENOUS
  Administered 2023-08-22: 5 mg via INTRAVENOUS

## 2023-08-22 MED ORDER — ORAL CARE MOUTH RINSE: 15.0000 mL | Freq: Once | OROMUCOSAL | Status: AC

## 2023-08-22 MED ORDER — MIDAZOLAM HCL 2 MG/2ML IJ SOLN
INTRAMUSCULAR | Status: AC
  Filled 2023-08-22: qty 2

## 2023-08-22 MED ORDER — PROPOFOL 10 MG/ML IV BOLUS
INTRAVENOUS | Status: AC
  Filled 2023-08-22: qty 20

## 2023-08-22 MED ORDER — PHENYLEPHRINE 80 MCG/ML (10ML) SYRINGE FOR IV PUSH (FOR BLOOD PRESSURE SUPPORT)
PREFILLED_SYRINGE | INTRAVENOUS | Status: AC
  Filled 2023-08-22: qty 10

## 2023-08-22 MED ORDER — ROCURONIUM BROMIDE 10 MG/ML (PF) SYRINGE
PREFILLED_SYRINGE | INTRAVENOUS | Status: AC
  Filled 2023-08-22: qty 10

## 2023-08-22 MED ORDER — PANTOPRAZOLE SODIUM 40 MG PO TBEC
40.0000 mg | DELAYED_RELEASE_TABLET | Freq: Every day | ORAL | Status: DC
  Administered 2023-08-23: 40 mg via ORAL
  Filled 2023-08-22: qty 1

## 2023-08-22 MED ORDER — KETAMINE HCL 50 MG/5ML IJ SOSY
PREFILLED_SYRINGE | INTRAMUSCULAR | Status: DC | PRN
Start: 2023-08-22 — End: 2023-08-22
  Administered 2023-08-22: 30 mg via INTRAVENOUS
  Administered 2023-08-22: 20 mg via INTRAVENOUS

## 2023-08-22 MED ORDER — CHLORHEXIDINE GLUCONATE 0.12 % MT SOLN
15.0000 mL | Freq: Once | OROMUCOSAL | Status: AC
  Administered 2023-08-22: 15 mL via OROMUCOSAL

## 2023-08-22 MED ORDER — HYDROMORPHONE HCL 1 MG/ML IJ SOLN: 1.0000 mg | INTRAMUSCULAR | Status: DC | PRN

## 2023-08-22 MED ORDER — CEFAZOLIN SODIUM-DEXTROSE 2-4 GM/100ML-% IV SOLN
2.0000 g | INTRAVENOUS | Status: AC
  Administered 2023-08-22: 2 g via INTRAVENOUS
  Filled 2023-08-22: qty 100

## 2023-08-22 MED ORDER — KETAMINE HCL 50 MG/5ML IJ SOSY
PREFILLED_SYRINGE | INTRAMUSCULAR | Status: AC
  Filled 2023-08-22: qty 5

## 2023-08-22 MED ORDER — PHENYLEPHRINE HCL-NACL 20-0.9 MG/250ML-% IV SOLN
INTRAVENOUS | Status: DC | PRN
  Administered 2023-08-22: 40 ug/min via INTRAVENOUS

## 2023-08-22 MED ORDER — TRAMADOL HCL 50 MG PO TABS
50.0000 mg | ORAL_TABLET | Freq: Four times a day (QID) | ORAL | Status: DC | PRN
  Administered 2023-08-22: 50 mg via ORAL
  Filled 2023-08-22: qty 1

## 2023-08-22 MED ORDER — DROPERIDOL 2.5 MG/ML IJ SOLN: 0.6250 mg | Freq: Once | INTRAMUSCULAR | Status: DC | PRN

## 2023-08-22 MED ORDER — FENTANYL CITRATE (PF) 100 MCG/2ML IJ SOLN
INTRAMUSCULAR | Status: DC | PRN
  Administered 2023-08-22: 25 ug via INTRAVENOUS
  Administered 2023-08-22: 100 ug via INTRAVENOUS

## 2023-08-22 MED ORDER — PHENYLEPHRINE HCL-NACL 20-0.9 MG/250ML-% IV SOLN
INTRAVENOUS | Status: AC
  Filled 2023-08-22: qty 250

## 2023-08-22 MED ORDER — ACETAMINOPHEN 500 MG PO TABS
1000.0000 mg | ORAL_TABLET | Freq: Once | ORAL | Status: AC
  Administered 2023-08-22: 1000 mg via ORAL
  Filled 2023-08-22: qty 2

## 2023-08-22 MED ORDER — SERTRALINE HCL 50 MG PO TABS
50.0000 mg | ORAL_TABLET | Freq: Every day | ORAL | Status: DC
  Administered 2023-08-23: 50 mg via ORAL
  Filled 2023-08-22: qty 1

## 2023-08-22 MED ORDER — FENTANYL CITRATE (PF) 100 MCG/2ML IJ SOLN
INTRAMUSCULAR | Status: AC
  Filled 2023-08-22: qty 2

## 2023-08-22 MED ORDER — ONDANSETRON HCL 4 MG/2ML IJ SOLN
INTRAMUSCULAR | Status: DC | PRN
  Administered 2023-08-22: 4 mg via INTRAVENOUS

## 2023-08-22 MED ORDER — ONDANSETRON HCL 4 MG/2ML IJ SOLN
4.0000 mg | Freq: Four times a day (QID) | INTRAMUSCULAR | Status: DC | PRN
  Administered 2023-08-22: 4 mg via INTRAVENOUS
  Filled 2023-08-22: qty 2

## 2023-08-22 MED ORDER — BUPIVACAINE HCL (PF) 0.25 % IJ SOLN
INTRAMUSCULAR | Status: AC
  Filled 2023-08-22: qty 30

## 2023-08-22 MED ORDER — DEXAMETHASONE SODIUM PHOSPHATE 10 MG/ML IJ SOLN
INTRAMUSCULAR | Status: AC
  Filled 2023-08-22: qty 1

## 2023-08-22 MED ORDER — ONDANSETRON 4 MG PO TBDP: 4.0000 mg | ORAL_TABLET | Freq: Four times a day (QID) | ORAL | Status: DC | PRN

## 2023-08-22 MED ORDER — 0.9 % SODIUM CHLORIDE (POUR BTL) OPTIME
TOPICAL | Status: DC | PRN
  Administered 2023-08-22: 1000 mL

## 2023-08-22 MED ORDER — PROPOFOL 10 MG/ML IV BOLUS
INTRAVENOUS | Status: DC | PRN
  Administered 2023-08-22: 120 mg via INTRAVENOUS
  Administered 2023-08-22: 30 mg via INTRAVENOUS

## 2023-08-22 MED ORDER — HEMOSTATIC AGENTS (NO CHARGE) OPTIME
TOPICAL | Status: DC | PRN
  Administered 2023-08-22: 1 via TOPICAL

## 2023-08-22 MED ORDER — BUPIVACAINE HCL 0.25 % IJ SOLN
INTRAMUSCULAR | Status: DC | PRN
  Administered 2023-08-22: 20 mL

## 2023-08-22 MED ORDER — HEMOSTATIC AGENTS (NO CHARGE) OPTIME: TOPICAL | Status: DC | PRN

## 2023-08-22 MED ORDER — FENTANYL CITRATE PF 50 MCG/ML IJ SOSY
25.0000 ug | PREFILLED_SYRINGE | INTRAMUSCULAR | Status: DC | PRN
  Administered 2023-08-22: 50 ug via INTRAVENOUS

## 2023-08-22 MED ORDER — OXYCODONE HCL 5 MG/5ML PO SOLN: 5.0000 mg | Freq: Once | ORAL | Status: AC | PRN

## 2023-08-22 MED ORDER — LACTATED RINGERS IV SOLN: INTRAVENOUS | Status: DC | PRN

## 2023-08-22 MED ORDER — ONDANSETRON HCL 4 MG/2ML IJ SOLN
INTRAMUSCULAR | Status: AC
  Filled 2023-08-22: qty 2

## 2023-08-22 MED ORDER — ACETAMINOPHEN 325 MG PO TABS
650.0000 mg | ORAL_TABLET | Freq: Four times a day (QID) | ORAL | Status: DC | PRN
  Administered 2023-08-22 (×2): 650 mg via ORAL
  Filled 2023-08-22 (×2): qty 2

## 2023-08-22 MED ORDER — ACETAMINOPHEN 10 MG/ML IV SOLN: 1000.0000 mg | Freq: Once | INTRAVENOUS | Status: DC | PRN

## 2023-08-22 MED ORDER — OXYCODONE HCL 5 MG PO TABS: 5.0000 mg | ORAL_TABLET | ORAL | Status: DC | PRN

## 2023-08-22 MED ORDER — OXYCODONE HCL 5 MG PO TABS
5.0000 mg | ORAL_TABLET | Freq: Once | ORAL | Status: AC | PRN
  Administered 2023-08-22: 5 mg via ORAL

## 2023-08-22 MED ORDER — ACETAMINOPHEN 650 MG RE SUPP: 650.0000 mg | Freq: Four times a day (QID) | RECTAL | Status: DC | PRN

## 2023-08-22 MED ORDER — LIDOCAINE HCL (PF) 2 % IJ SOLN
INTRAMUSCULAR | Status: AC
  Filled 2023-08-22: qty 5

## 2023-08-22 MED ORDER — MIDAZOLAM HCL 5 MG/5ML IJ SOLN
INTRAMUSCULAR | Status: DC | PRN
  Administered 2023-08-22: 2 mg via INTRAVENOUS

## 2023-08-22 MED ORDER — SUGAMMADEX SODIUM 200 MG/2ML IV SOLN
INTRAVENOUS | Status: DC | PRN
  Administered 2023-08-22: 200 mg via INTRAVENOUS

## 2023-08-22 MED ORDER — ROCURONIUM BROMIDE 100 MG/10ML IV SOLN
INTRAVENOUS | Status: DC | PRN
  Administered 2023-08-22: 20 mg via INTRAVENOUS
  Administered 2023-08-22: 10 mg via INTRAVENOUS
  Administered 2023-08-22: 70 mg via INTRAVENOUS

## 2023-08-22 MED ORDER — SODIUM CHLORIDE 0.45 % IV SOLN: INTRAVENOUS | Status: DC

## 2023-08-22 MED ORDER — DEXAMETHASONE SODIUM PHOSPHATE 10 MG/ML IJ SOLN
INTRAMUSCULAR | Status: DC | PRN
  Administered 2023-08-22: 8 mg via INTRAVENOUS

## 2023-08-22 MED ORDER — LIDOCAINE HCL (CARDIAC) PF 100 MG/5ML IV SOSY
PREFILLED_SYRINGE | INTRAVENOUS | Status: DC | PRN
  Administered 2023-08-22: 100 mg via INTRAVENOUS

## 2023-08-22 MED ORDER — OXYCODONE HCL 5 MG PO TABS
ORAL_TABLET | ORAL | Status: AC
Start: 2023-08-22 — End: 2023-08-22
  Filled 2023-08-22: qty 1

## 2023-08-22 MED ORDER — LACTATED RINGERS IV SOLN: INTRAVENOUS | Status: DC

## 2023-08-22 MED ORDER — CHLORHEXIDINE GLUCONATE CLOTH 2 % EX PADS: 6.0000 | MEDICATED_PAD | Freq: Once | CUTANEOUS | Status: DC

## 2023-08-22 MED ORDER — FENTANYL CITRATE PF 50 MCG/ML IJ SOSY
PREFILLED_SYRINGE | INTRAMUSCULAR | Status: AC
  Filled 2023-08-22: qty 1

## 2023-08-22 SURGICAL SUPPLY — 30 items
ATTRACTOMAT 16X20 MAGNETIC DRP (DRAPES) ×2 IMPLANT
BAG COUNTER SPONGE SURGICOUNT (BAG) ×2 IMPLANT
BLADE SURG 15 STRL LF DISP TIS (BLADE) ×2 IMPLANT
CHLORAPREP W/TINT 26 (MISCELLANEOUS) ×2 IMPLANT
CLIP TI MEDIUM 6 (CLIP) ×4 IMPLANT
CLIP TI WIDE RED SMALL 6 (CLIP) ×4 IMPLANT
CLIP VESOCCLUDE SM WIDE 6/CT (CLIP) IMPLANT
COVER SURGICAL LIGHT HANDLE (MISCELLANEOUS) ×2 IMPLANT
DERMABOND ADVANCED .7 DNX12 (GAUZE/BANDAGES/DRESSINGS) ×2 IMPLANT
DRAPE LAPAROTOMY T 98X78 PEDS (DRAPES) ×2 IMPLANT
DRAPE UTILITY XL STRL (DRAPES) ×2 IMPLANT
ELECT REM PT RETURN 15FT ADLT (MISCELLANEOUS) ×2 IMPLANT
GAUZE 4X4 16PLY ~~LOC~~+RFID DBL (SPONGE) ×2 IMPLANT
GLOVE SURG ORTHO 8.0 STRL STRW (GLOVE) ×2 IMPLANT
GOWN STRL REUS W/ TWL XL LVL3 (GOWN DISPOSABLE) ×6 IMPLANT
HEMOSTAT SURGICEL 2X4 FIBR (HEMOSTASIS) ×2 IMPLANT
ILLUMINATOR WAVEGUIDE N/F (MISCELLANEOUS) IMPLANT
KIT BASIN OR (CUSTOM PROCEDURE TRAY) ×2 IMPLANT
KIT TURNOVER KIT A (KITS) IMPLANT
NDL HYPO 22X1.5 SAFETY MO (MISCELLANEOUS) ×2 IMPLANT
NEEDLE HYPO 22X1.5 SAFETY MO (MISCELLANEOUS) ×1 IMPLANT
PACK BASIC VI WITH GOWN DISP (CUSTOM PROCEDURE TRAY) ×2 IMPLANT
PENCIL SMOKE EVACUATOR (MISCELLANEOUS) ×2 IMPLANT
SHEARS HARMONIC 9CM CVD (BLADE) IMPLANT
SUT MNCRL AB 4-0 PS2 18 (SUTURE) ×2 IMPLANT
SUT VIC AB 3-0 SH 18 (SUTURE) ×2 IMPLANT
SYR BULB IRRIG 60ML STRL (SYRINGE) ×2 IMPLANT
SYR CONTROL 10ML LL (SYRINGE) ×2 IMPLANT
TOWEL OR 17X26 10 PK STRL BLUE (TOWEL DISPOSABLE) ×2 IMPLANT
TUBING CONNECTING 10 (TUBING) ×2 IMPLANT

## 2023-08-22 NOTE — Transfer of Care (Signed)
 Immediate Anesthesia Transfer of Care Note  Patient: Tamara Terry  Procedure(s) Performed: LEFT INFERIOR PARATHYROIDECTOMY (Left)  Patient Location: PACU  Anesthesia Type:General  Level of Consciousness: sedated and responds to stimulation  Airway & Oxygen Therapy: Patient Spontanous Breathing  Post-op Assessment: Report given to RN  Post vital signs: stable  Last Vitals:  Vitals Value Taken Time  BP 117/58 08/22/23 1003  Temp 36.4 C 08/22/23 1003  Pulse 78 08/22/23 1012  Resp 13 08/22/23 1012  SpO2 94 % 08/22/23 1012  Vitals shown include unfiled device data.  Last Pain:  Vitals:   08/22/23 0635  TempSrc:   PainSc: 0-No pain         Complications: No notable events documented.

## 2023-08-22 NOTE — Plan of Care (Signed)

## 2023-08-22 NOTE — Interval H&P Note (Signed)
 History and Physical Interval Note:  08/22/2023 7:04 AM  Tamara Terry  has presented today for surgery, with the diagnosis of PRIMARY HYPEPARATHYROIDISM.  The various methods of treatment have been discussed with the patient and family. After consideration of risks, benefits and other options for treatment, the patient has consented to    Procedure(s): LEFT INFERIOR PARATHYROIDECTOMY (Left) as a surgical intervention.    The patient's history has been reviewed, patient examined, no change in status, stable for surgery.  I have reviewed the patient's chart and labs.  Questions were answered to the patient's satisfaction.    Darnell Level, MD [facility] A [facility] practice Office: [phone]   Darnell Level

## 2023-08-22 NOTE — Anesthesia Postprocedure Evaluation (Signed)
 Anesthesia Post Note  Patient: Tamara Terry  Procedure(s) Performed: LEFT INFERIOR PARATHYROIDECTOMY (Left)     Patient location during evaluation: PACU Anesthesia Type: General Level of consciousness: awake and alert Pain management: pain level controlled Vital Signs Assessment: post-procedure vital signs reviewed and stable Respiratory status: spontaneous breathing, nonlabored ventilation, respiratory function stable and patient connected to nasal cannula oxygen Cardiovascular status: blood pressure returned to baseline and stable Postop Assessment: no apparent nausea or vomiting Anesthetic complications: no   No notable events documented.  Last Vitals:  Vitals:   08/22/23 1100 08/22/23 1115  BP: 135/66 (!) 120/54  Pulse: 72 81  Resp: 12 (!) 27  Temp:    SpO2: 91% 93%    Last Pain:  Vitals:   08/22/23 1115  TempSrc:   PainSc: 3                  [place]

## 2023-08-22 NOTE — Anesthesia Procedure Notes (Signed)
 Procedure Name: Intubation Date/Time: 08/22/2023 7:38 AM  Performed by: Micki Riley, CRNAPre-anesthesia Checklist: Patient identified, Emergency Drugs available, Suction available, Patient being monitored and Timeout performed Patient Re-evaluated:Patient Re-evaluated prior to induction Oxygen Delivery Method: Circle system utilized Preoxygenation: Pre-oxygenation with 100% oxygen Induction Type: IV induction Ventilation: Mask ventilation without difficulty Laryngoscope Size: Miller and 2 Grade View: Grade II Tube size: 7.0 mm Number of attempts: 2 Airway Equipment and Method: Stylet Placement Confirmation: ETT inserted through vocal cords under direct vision Secured at: 21 cm Tube secured with: Tape Dental Injury: Teeth and Oropharynx as per pre-operative assessment

## 2023-08-22 NOTE — Op Note (Signed)
 Operative Note  Pre-operative Diagnosis:  primary hyperparathyroidism  Post-operative Diagnosis:  same  Surgeon:  Darnell Level, MD  Assistant:  Diamond Nickel, RN   Procedure:  Neck exploration with parathyroidectomy (right superior gland)  Anesthesia:  general  Estimated Blood Loss:  20 cc  Drains: none         Specimen: to pathology  Indications:  Patient is referred by Ronny Bacon, NP, for surgical evaluation and management of primary hyperparathyroidism. Had been noted to have hypercalcemia dating back as long as 74. Recent calcium level is elevated at 11.1 with an elevated intact PTH level of 78. 24-hour urine collection for calcium was normal at 171. 25-hydroxy vitamin D level was normal at 34.4. Patient has not had any imaging studies performed. Patient does have complications including a bone density scan demonstrating osteoporosis. Patient notes significant fatigue. She denies any recent fractures. She denies nephrolithiasis. She denies bone and joint pain. There is no family history of parathyroid disease or other endocrine neoplasms. Patient is accompanied by her husband. 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  Procedure:  The patient was seen in the pre-op holding area. The risks, benefits, complications, treatment options, and expected outcomes were previously discussed with the patient. The patient agreed with the proposed plan and has signed the informed consent form.  The patient was brought to the operating room by the surgical team, identified as [name] and the procedure verified. A "time out" was completed and the above information confirmed.  Following induction of general anesthesia, the patient is positioned and then prepped and draped in the usual aseptic fashion.  After ascertaining that an adequate level of anesthesia been achieved, an anterior incision is made in the left neck with a #15 blade.  Dissection was carried through  subcutaneous tissues and platysma.  Hemostasis is achieved with the electrocautery.  Skin flaps are developed circumferentially and a self-retaining retractors placed for exposure.  Strap muscles are incised in the midline.  They are reflected to the left exposing the inferior pole of the left thyroid lobe.  Thyroid tissue appears normal.  There appears to be a nodular mass just inferior to the thyroid lobe.  This is gently mobilized and dissected out.  It measures approximately 2.5 cm in greatest diameter.  Vascular structures are divided between small ligaclips and the gland is excised in its entirety.  This is suspicious for an enlarged left inferior parathyroid gland consistent with adenoma.  It is submitted to pathology for frozen section.  However, frozen section revealed what appears to be thyroid tissue.  This could be thyroidization of the parathyroid but it is impossible to tell on frozen section.  Based on this information, we continued neck exploration.  On the left side we mobilized the left thyroid lobe.  Just above the level of the inferior thyroid artery is a normal left superior parathyroid gland which was left in situ.  Incision was then extended across the midline.  Skin flaps were developed further and self-retaining retractors were placed.  Midline strap muscles were separated further.  Strap muscles were reflected off the right thyroid lobe.  It was mobilized.  Dissection laterally and posteriorly revealed an enlarged parathyroid gland likely representing the right superior gland which appeared consistent with adenoma.  This was mobilized.  Vascular structures were divided between small ligaclips and the gland was excised.  Frozen section biopsy confirmed hypercellular parathyroid tissue consistent with adenoma.  No other enlarged parathyroid tissue was  seen on the right side.  Neck was irrigated with warm saline.  Good hemostasis was achieved throughout the operative field.  Fibrillar  was placed throughout the operative field.  Strap muscles are reapproximated in the midline of interrupted 3-0 Vicryl sutures.  Platysma is closed with interrupted 3-0 Vicryl sutures.  Skin is anesthetized with local anesthetic.  Skin edges are reapproximated with a running 4-0 Monocryl subcuticular suture.  Wound was washed and dried and Dermabond is applied as dressing.  Patient is awakened from anesthesia and transferred to the recovery room in stable condition.  The patient tolerated the procedure well.  Patient will be kept overnight for observation.  Patient's family was informed of her progress.   Darnell Level, MD [facility] Office: [phone]

## 2023-08-23 ENCOUNTER — Encounter (HOSPITAL_COMMUNITY): Payer: Self-pay | Admitting: Surgery

## 2023-08-23 DIAGNOSIS — Z87891 Personal history of nicotine dependence: Secondary | ICD-10-CM | POA: Diagnosis not present

## 2023-08-23 DIAGNOSIS — K219 Gastro-esophageal reflux disease without esophagitis: Secondary | ICD-10-CM | POA: Diagnosis not present

## 2023-08-23 DIAGNOSIS — F32A Depression, unspecified: Secondary | ICD-10-CM | POA: Diagnosis not present

## 2023-08-23 DIAGNOSIS — E210 Primary hyperparathyroidism: Secondary | ICD-10-CM | POA: Diagnosis not present

## 2023-08-23 DIAGNOSIS — M1990 Unspecified osteoarthritis, unspecified site: Secondary | ICD-10-CM | POA: Diagnosis not present

## 2023-08-23 DIAGNOSIS — M810 Age-related osteoporosis without current pathological fracture: Secondary | ICD-10-CM | POA: Diagnosis not present

## 2023-08-23 LAB — SURGICAL PATHOLOGY

## 2023-08-23 LAB — CALCIUM: Calcium: 8.3 mg/dL — ABNORMAL LOW (ref 8.9–10.3)

## 2023-08-23 MED ORDER — TRAMADOL HCL 50 MG PO TABS: 50.0000 mg | ORAL_TABLET | Freq: Four times a day (QID) | ORAL | 0 refills | Status: DC | PRN

## 2023-08-23 NOTE — Progress Notes (Signed)
 AVS reviewed w/ pt who verbalized an understanding- no other questions- PIV removed as noted. Pt dressing for d/c to home. Husband enroute to hospital to pick pt up.

## 2023-08-23 NOTE — Discharge Summary (Signed)
    Physician Discharge Summary   Patient ID: Tamara Terry MRN: [id number] DOB/AGE: 03/19/1961 63 y.o.  Admit date: 08/22/2023  Discharge date: 08/23/2023  Discharge Diagnoses:  Principal Problem:   Hyperparathyroidism, primary [facility]) Active Problems:   Primary hyperparathyroidism [facility])   Discharged Condition: good  Hospital Course: Patient was admitted for observation following parathyroid surgery.  Post op course was uncomplicated.  Pain was well controlled.  Tolerated diet.  Post op calcium level on morning following surgery was 8.3 mg/dl.  Patient was prepared for discharge home on POD#1.  Consults: None  Treatments: surgery: neck exploration with parathyroidectomy  Discharge Exam: Blood pressure (!) 111/57, pulse 65, temperature 97.6 F (36.4 C), temperature source Oral, resp. rate 16, height 5\' 5"  (1.651 m), weight 96.6 kg, SpO2 92%. HEENT - clear Neck - wound dry and intact; mild ecchymosis; mild STS; voice normal  Disposition: Home  Discharge Instructions     Diet - low sodium heart healthy   Complete by: As directed    Increase activity slowly   Complete by: As directed    No dressing needed   Complete by: As directed       Allergies as of 08/23/2023       Reactions   Azithromycin    Latex         Medication List     TAKE these medications    B-12 5000 MCG Caps Take 10,000 mcg by mouth daily.   cetirizine 10 MG tablet Commonly known as: ZYRTEC Take 10 mg by mouth daily.   FERROUS SULFATE PO Take 9 mg by mouth in the morning and at bedtime.   HAIR SKIN & NAILS GUMMIES PO Take 2 tablets by mouth daily.   Magnesium Oxide -Mg Supplement 250 MG Tabs Take 500 mg by mouth daily.   meloxicam 15 MG tablet Commonly known as: MOBIC Take 15 mg by mouth daily.   metoprolol tartrate 25 MG tablet Commonly known as: LOPRESSOR Take 0.5 tablets (12.5 mg total) by mouth 2 (two) times daily. (May take an extra 1/2 as needed for palpitations.)    omeprazole 20 MG capsule Commonly known as: PRILOSEC TAKE ONE (1) CAPSULE BY MOUTH TWICE A DAY. (EVERY 12 HOURS.)   sertraline 50 MG tablet Commonly known as: ZOLOFT Take 50 mg by mouth daily.   traMADol 50 MG tablet Commonly known as: ULTRAM Take 1 tablet (50 mg total) by mouth every 6 (six) hours as needed for moderate pain (pain score 4-6).   VITAMIN D-3 PO Take 2,000 Units by mouth daily.               Discharge Care Instructions  (From admission, onward)           Start     Ordered   08/23/23 0000  No dressing needed        08/23/23 4098            Follow-up Information     Darnell Level, MD. Schedule an appointment as soon as possible for a visit in 3 week(s).   Specialty: General Surgery Why: For wound re-check Contact information: [address] [place] [address] [phone]                 Darnell Level, MD [facility] Office: [phone]   Signed: Darnell Level 08/23/2023, 7:14 AM

## 2023-08-23 NOTE — Progress Notes (Signed)
   08/23/23 0835  TOC Brief Assessment  Insurance and Status Reviewed  Patient has primary care physician Yes  Home environment has been reviewed resides in private residence with spouse  Prior level of function: Independent  Prior/Current Home Services No current home services  Social Drivers of Health Review SDOH reviewed no interventions necessary  Readmission risk has been reviewed Yes  Transition of care needs no transition of care needs at this time

## 2023-08-23 NOTE — Discharge Instructions (Signed)

## 2023-08-30 DIAGNOSIS — E210 Primary hyperparathyroidism: Secondary | ICD-10-CM | POA: Diagnosis not present

## 2023-08-30 DIAGNOSIS — Z9889 Other specified postprocedural states: Secondary | ICD-10-CM | POA: Diagnosis not present

## 2023-08-30 DIAGNOSIS — Z9089 Acquired absence of other organs: Secondary | ICD-10-CM | POA: Diagnosis not present

## 2023-09-10 DIAGNOSIS — Z9889 Other specified postprocedural states: Secondary | ICD-10-CM | POA: Insufficient documentation

## 2023-11-03 ENCOUNTER — Ambulatory Visit
Admission: EM | Admit: 2023-11-03 | Discharge: 2023-11-03 | Disposition: A | Discharge status: Home / Self Care | Attending: Nurse Practitioner | Admitting: Nurse Practitioner

## 2023-11-03 ENCOUNTER — Encounter: Payer: Self-pay | Admitting: Emergency Medicine

## 2023-11-03 DIAGNOSIS — R399 Unspecified symptoms and signs involving the genitourinary system: Secondary | ICD-10-CM | POA: Diagnosis not present

## 2023-11-03 DIAGNOSIS — N3010 Interstitial cystitis (chronic) without hematuria: Secondary | ICD-10-CM | POA: Diagnosis not present

## 2023-11-03 LAB — POCT URINALYSIS DIP (MANUAL ENTRY)
Bilirubin, UA: NEGATIVE
Glucose, UA: NEGATIVE mg/dL
Ketones, POC UA: NEGATIVE mg/dL
Leukocytes, UA: NEGATIVE
Nitrite, UA: NEGATIVE
Protein Ur, POC: NEGATIVE mg/dL
Spec Grav, UA: 1.025
Urobilinogen, UA: 0.2 U/dL
pH, UA: 5.5

## 2023-11-03 MED ORDER — PHENAZOPYRIDINE HCL 100 MG PO TABS
100.0000 mg | ORAL_TABLET | Freq: Three times a day (TID) | ORAL | 0 refills | Status: AC | PRN
Start: 2023-11-03 — End: ?

## 2023-11-03 NOTE — ED Triage Notes (Signed)
Urinary urgency and burning since yesterday.

## 2023-11-03 NOTE — Discharge Instructions (Signed)
 A urine culture has been ordered.  You will be contacted if the pending test result is abnormal.  You also have access to the results via MyChart. Take medication as prescribed. Continue to increase fluids.  Try to drink at least 8-10 8 ounce glasses of water  daily. Develop a toileting schedule that will allow you to urinate at least every 2 hours. Avoid caffeine such as tea, soda, or coffee while symptoms persist. If the result is negative and you are continuing to experience symptoms, recommend following up with your urologist for further evaluation. Follow-up as needed.

## 2023-11-03 NOTE — ED Provider Notes (Signed)
 [facility]    CSN: [id number] Arrival date & time: 11/03/23  1319      History   Chief Complaint No chief complaint on file.   HPI Tamara Terry is a 63 y.o. female.   The history is provided by the patient.   Patient presents with a 1 day history of pain with urination, lower abdominal pain, and urinary frequency and urgency.  Patient denies fever, chills, headache, chest pain, hematuria, decreased urine stream, flank pain, or low back pain.  Patient reports underlying history of interstitial cystitis.  Denies history of pyelonephritis or kidney stones.  Past Medical History:  Diagnosis Date   Arthritis    Chronic back pain    Depression    GERD (gastroesophageal reflux disease)    Interstitial cystitis    Neuropathy of both feet    Obesity    Plantar fasciitis, bilateral    SVT (supraventricular tachycardia) (HCC)     Patient Active Problem List   Diagnosis Date Noted   Primary hyperparathyroidism (HCC) 08/22/2023   Hyperparathyroidism, primary (HCC) 08/15/2023   Screening mammogram for breast cancer 08/16/2022   Screening for colorectal cancer 08/16/2022   Encounter for screening fecal occult blood testing 08/16/2022   Encounter for gynecological examination with Papanicolaou smear of cervix 08/16/2022   Labial abscess 08/16/2022   Obesity 04/12/2014   DIARRHEA 08/01/2010   ABDOMINAL PAIN, EPIGASTRIC 08/01/2010    Past Surgical History:  Procedure Laterality Date   CHOLECYSTECTOMY  06/25/1988   CYSTO WITH HYDRODISTENSION  11/07/2011   Procedure: CYSTOSCOPY/HYDRODISTENSION;  Surgeon: Trent Frizzle, MD;  Location[facility];  Service: Urology;  Laterality: N/A;  PYRIDIUM  AND MARCAINE    HYSTEROSCOPY WITH D & C  07/21/2004   MASTECTOMY     PARATHYROIDECTOMY Left 08/22/2023   Procedure: LEFT INFERIOR PARATHYROIDECTOMY;  Surgeon: Oralee Billow, MD;  Location: [facility];  Service: General;  Laterality: Left;    OB History      Gravida  1   Para  1   Term      Preterm      AB      Living  1      SAB      IAB      Ectopic      Multiple      Live Births               Home Medications    Prior to Admission medications   Medication Sig Start Date End Date Taking? Authorizing Provider  metoprolol  tartrate (LOPRESSOR ) 25 MG tablet Take 0.5 tablets (12.5 mg total) by mouth 2 (two) times daily. (May take an extra 1/2 as needed for palpitations.) 07/18/23  Yes Branch, Joyceann No, MD  phenazopyridine  (PYRIDIUM ) 100 MG tablet Take 1 tablet (100 mg total) by mouth 3 (three) times daily as needed for pain. 11/03/23  Yes Leath-Warren, Belen Bowers, NP  Semaglutide-Weight Management (WEGOVY) 0.25 MG/0.5ML SOAJ Inject 0.25 mg into the skin once a week.   Yes [provider]  Biotin w/ Vitamins C & E (HAIR SKIN & NAILS GUMMIES PO) Take 2 tablets by mouth daily.    [provider]  cetirizine (ZYRTEC) 10 MG tablet Take 10 mg by mouth daily.    [provider]  Cholecalciferol (VITAMIN D -3 PO) Take 2,000 Units by mouth daily.    [provider]  Cyanocobalamin (B-12) 5000 MCG CAPS Take 10,000 mcg by mouth daily.    [provider]  FERROUS SULFATE PO Take 9 mg by mouth in the morning and at bedtime.    [provider]  meloxicam (MOBIC) 15 MG tablet Take 15 mg by mouth daily.    [provider]  omeprazole  (PRILOSEC) 20 MG capsule TAKE ONE (1) CAPSULE BY MOUTH TWICE A DAY. (EVERY 12 HOURS.) 08/17/22   Javan Messing, NP  sertraline  (ZOLOFT ) 50 MG tablet Take 50 mg by mouth daily. 09/30/19   [provider]    Family History Family History  Problem Relation Age of Onset   Heart disease Mother    Diabetes Mother    Hypertension Mother    Parkinson's disease Mother    Kidney disease Mother    Colon polyps Mother    Heart attack Father    Diabetes Sister    Diabetes Maternal Grandmother    Cancer Maternal Grandfather     Social  History Social History   Tobacco Use   Smoking status: Former    Current packs/day: 0.00    Types: Cigarettes    Quit date: 06/25/1994    Years since quitting: 29.3   Smokeless tobacco: Never  Vaping Use   Vaping status: Never Used  Substance Use Topics   Alcohol use: Not Currently   Drug use: No     Allergies   Azithromycin and Latex   Review of Systems Review of Systems Per HPI  Physical Exam Triage Vital Signs ED Triage Vitals  Encounter Vitals Group     BP 11/03/23 1326 107/60     Systolic BP Percentile --      Diastolic BP Percentile --      Pulse Rate 11/03/23 1326 76     Resp 11/03/23 1326 18     Temp 11/03/23 1326 98 F (36.7 C)     Temp Source 11/03/23 1326 Oral     SpO2 11/03/23 1330 94 %     Weight --      Height --      Head Circumference --      Peak Flow --      Pain Score 11/03/23 1328 5     Pain Loc --      Pain Education --      Exclude from Growth Chart --    No data found.  Updated Vital Signs BP 107/60 (BP Location: Right Arm)   Pulse 76   Temp 98 F (36.7 C) (Oral)   Resp 18   SpO2 94%   Visual Acuity Right Eye Distance:   Left Eye Distance:   Bilateral Distance:    Right Eye Near:   Left Eye Near:    Bilateral Near:     Physical Exam Vitals and nursing note reviewed.  Constitutional:      General: She is not in acute distress.    Appearance: Normal appearance.  HENT:     Head: Normocephalic.  Eyes:     Extraocular Movements: Extraocular movements intact.     Pupils: Pupils are equal, round, and reactive to light.  Cardiovascular:     Rate and Rhythm: Normal rate and regular rhythm.     Pulses: Normal pulses.     Heart sounds: Normal heart sounds.  Pulmonary:     Effort: No respiratory distress.     Breath sounds: Normal breath sounds. No stridor. No wheezing, rhonchi or rales.  Chest:     Chest wall: No tenderness.  Abdominal:     General: Bowel sounds are normal.  Palpations: Abdomen is soft.      Tenderness: There is abdominal tenderness in the suprapubic area.  Musculoskeletal:     Cervical back: Normal range of motion.  Lymphadenopathy:     Cervical: No cervical adenopathy.  Skin:    General: Skin is warm and dry.  Neurological:     General: No focal deficit present.     Mental Status: She is alert and oriented to person, place, and time.  Psychiatric:        Mood and Affect: Mood normal.        Behavior: Behavior normal.      UC Treatments / Results  Labs (all labs ordered are listed, but only abnormal results are displayed) Labs Reviewed  POCT URINALYSIS DIP (MANUAL ENTRY) - Abnormal; Notable for the following components:      Result Value   Blood, UA trace-intact (*)    All other components within normal limits  URINE CULTURE    EKG   Radiology No results found.  Procedures Procedures (including critical care time)  Medications Ordered in UC Medications - No data to display  Initial Impression / Assessment and Plan / UC Course  I have reviewed the triage vital signs and the nursing notes.  Pertinent labs & imaging results that were available during my care of the patient were reviewed by me and considered in my medical decision making (see chart for details).  Urinalysis does not indicate an obvious urinary tract infection.  Patient does have trace blood.  Urine culture is pending.  Low suspicion for UTI based on urinalysis.  Will treat for dysuria with Pyridium  100 mg.  Patient with underlying history of interstitial cystitis, which most likely may be causing the patient's symptoms.  Supportive care recommendations were provided and discussed with the patient to include fluids, over-the-counter analgesics, avoiding caffeine, and developing a toileting schedule.  Patient advised to follow-up with urologist if urine culture is negative.  Patient was in agreement with this plan of care and verbalizes understanding.  All questions were answered.  Patient stable  for discharge.   Final Clinical Impressions(s) / UC Diagnoses   Final diagnoses:  UTI symptoms  Interstitial cystitis     Discharge Instructions      A urine culture has been ordered.  You will be contacted if the pending test result is abnormal.  You also have access to the results via MyChart. Take medication as prescribed. Continue to increase fluids.  Try to drink at least 8-10 8 ounce glasses of water  daily. Develop a toileting schedule that will allow you to urinate at least every 2 hours. Avoid caffeine such as tea, soda, or coffee while symptoms persist. If the result is negative and you are continuing to experience symptoms, recommend following up with your urologist for further evaluation. Follow-up as needed.   ED Prescriptions     Medication Sig Dispense Auth. Provider   phenazopyridine  (PYRIDIUM ) 100 MG tablet Take 1 tablet (100 mg total) by mouth 3 (three) times daily as needed for pain. 10 tablet Leath-Warren, Belen Bowers, NP      PDMP not reviewed this encounter.   Hardy Lia, NP 11/03/23 1348

## 2023-11-04 LAB — URINE CULTURE: Culture: NO GROWTH

## 2023-11-14 ENCOUNTER — Encounter: Payer: Self-pay | Admitting: Cardiology

## 2023-11-14 ENCOUNTER — Ambulatory Visit: Payer: 59 | Attending: Cardiology | Admitting: Cardiology

## 2023-11-14 VITALS — BP 112/72 | HR 65 | Ht 65.0 in | Wt 219.4 lb

## 2023-11-14 DIAGNOSIS — R002 Palpitations: Secondary | ICD-10-CM | POA: Diagnosis not present

## 2023-11-14 DIAGNOSIS — I471 Supraventricular tachycardia, unspecified: Secondary | ICD-10-CM | POA: Diagnosis not present

## 2023-11-14 NOTE — Patient Instructions (Signed)

## 2023-11-14 NOTE — Progress Notes (Signed)
 Clinical Summary [name] is a 63 y.o.female  1.Palpitations - ER visit 07/01/23 with near syncope, SOB, chest pain, palpitations - per EMS found to be in SVT 160s, soft bp's. Given 6mg  of adenosine and converted to SR - K 3.9, Mg 1.9, TSH 1.9 - ED EKG SR, LAFB - EMS runs sheet includes EKGs, narrow complex regular tachy 150s     - last visit started lopressor  12.5mg  bid.  - metoprolol  made her feel lightheaded, stopped taking after a week - no significant palpitations.     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     cetirizine (ZYRTEC) 10 MG tablet Take 10 mg by mouth daily.     Cholecalciferol (VITAMIN D -3 PO) Take 2,000 Units by mouth daily.     Cyanocobalamin (B-12) 5000 MCG CAPS Take 10,000 mcg by mouth daily.     FERROUS SULFATE PO Take 9 mg by mouth in the morning and at bedtime.     meloxicam (MOBIC) 15 MG tablet Take 15 mg by mouth daily.     metoprolol  tartrate (LOPRESSOR ) 25 MG tablet Take 0.5 tablets (12.5 mg total) by mouth 2 (two) times daily. (May take an extra 1/2 as needed for palpitations.) 45 tablet 6   omeprazole  (PRILOSEC) 20 MG capsule TAKE ONE (1) CAPSULE BY MOUTH TWICE A DAY. (EVERY 12 HOURS.) 60 capsule 11   phenazopyridine  (PYRIDIUM ) 100 MG tablet Take 1 tablet (100 mg total) by mouth 3 (three) times daily as needed for pain. 10 tablet 0   Semaglutide-Weight Management (WEGOVY) 0.25 MG/0.5ML SOAJ Inject  0.25 mg into the skin once a week.     sertraline  (ZOLOFT ) 50 MG tablet Take 50 mg by mouth daily.     No current facility-administered medications for this visit.   Facility-Administered Medications Ordered in Other Visits  Medication Dose Route Frequency Provider Last Rate Last Admin   bupivacaine  (MARCAINE ) 0.5 % 15 mL, phenazopyridine  (PYRIDIUM ) 400 mg bladder mixture   Bladder Instillation Once Trent Frizzle, MD         Past Surgical History:  Procedure Laterality Date   CHOLECYSTECTOMY  06/25/1988   CYSTO WITH HYDRODISTENSION  11/07/2011   Procedure: CYSTOSCOPY/HYDRODISTENSION;  Surgeon: Trent Frizzle, MD;  Location: [facility];  Service: Urology;  Laterality: N/A;  PYRIDIUM  AND MARCAINE    HYSTEROSCOPY WITH D & C  07/21/2004   MASTECTOMY     PARATHYROIDECTOMY Left 08/22/2023   Procedure: LEFT INFERIOR PARATHYROIDECTOMY;  Surgeon: Oralee Billow, MD;  Location: [facility];  Service: General;  Laterality: Left;     Allergies  Allergen Reactions   Azithromycin    Latex       Family History  Problem Relation Age of Onset   Heart disease Mother    Diabetes Mother    Hypertension Mother  Parkinson's disease Mother    Kidney disease Mother    Colon polyps Mother    Heart attack Father    Diabetes Sister    Diabetes Maternal Grandmother    Cancer Maternal Grandfather      Social History Ms. Gonder reports that she quit smoking about 29 years ago. Her smoking use included cigarettes. She has never used smokeless tobacco. Ms. Chestang reports that she does not currently use alcohol.     Physical Examination Today's Vitals   11/14/23 0902  BP: 112/72  Pulse: 65  SpO2: 95%  Weight: 219 lb 6.4 oz (99.5 kg)  Height: 5\' 5"  (1.651 m)  PainSc: 0-No pain   Body mass index is 36.51 kg/m.  Gen: resting comfortably, no acute distress HEENT: no scleral icterus, pupils equal round and reactive, no palptable cervical adenopathy,  CV: RRR, no mrg, no  jvd Resp: Clear to auscultation bilaterally GI: abdomen is soft, non-tender, non-distended, normal bowel sounds, no hepatosplenomegaly MSK: extremities are warm, no edema.  Skin: warm, no rash Neuro:  no focal deficits Psych: appropriate affect    Assessment and Plan  1.PSVT/palpitations - isolated episode, resolved with adenosine. Capture in the EMS runs sheet in the media section fo epic - did not tolerate low dose lopressor , causes dizziness. She has low normal resting heart rates limiting ability to tolerate av nodal agents - if significant SVT recurrences would need EP referral to consider antiarrhythmic vs ablation - discussed vagal maneuvers as well today in clinic  F/u 1 year.       Laurann Pollock, M.D

## 2023-12-12 DIAGNOSIS — N390 Urinary tract infection, site not specified: Secondary | ICD-10-CM | POA: Diagnosis not present

## 2023-12-23 DIAGNOSIS — N3010 Interstitial cystitis (chronic) without hematuria: Secondary | ICD-10-CM | POA: Diagnosis not present

## 2024-02-06 DIAGNOSIS — R7301 Impaired fasting glucose: Secondary | ICD-10-CM | POA: Diagnosis not present

## 2024-02-06 DIAGNOSIS — E782 Mixed hyperlipidemia: Secondary | ICD-10-CM | POA: Diagnosis not present

## 2024-02-13 DIAGNOSIS — Z0001 Encounter for general adult medical examination with abnormal findings: Secondary | ICD-10-CM | POA: Diagnosis not present

## 2024-02-13 DIAGNOSIS — D509 Iron deficiency anemia, unspecified: Secondary | ICD-10-CM | POA: Diagnosis not present

## 2024-02-13 DIAGNOSIS — E669 Obesity, unspecified: Secondary | ICD-10-CM | POA: Diagnosis not present

## 2024-02-13 DIAGNOSIS — F411 Generalized anxiety disorder: Secondary | ICD-10-CM | POA: Diagnosis not present

## 2024-02-13 DIAGNOSIS — Z6837 Body mass index (BMI) 37.0-37.9, adult: Secondary | ICD-10-CM | POA: Diagnosis not present

## 2024-02-13 DIAGNOSIS — E213 Hyperparathyroidism, unspecified: Secondary | ICD-10-CM | POA: Diagnosis not present

## 2024-02-13 DIAGNOSIS — M791 Myalgia, unspecified site: Secondary | ICD-10-CM | POA: Diagnosis not present

## 2024-02-13 DIAGNOSIS — K219 Gastro-esophageal reflux disease without esophagitis: Secondary | ICD-10-CM | POA: Diagnosis not present

## 2024-02-13 DIAGNOSIS — I471 Supraventricular tachycardia, unspecified: Secondary | ICD-10-CM | POA: Diagnosis not present

## 2024-02-13 DIAGNOSIS — R7303 Prediabetes: Secondary | ICD-10-CM | POA: Diagnosis not present

## 2024-02-13 DIAGNOSIS — E782 Mixed hyperlipidemia: Secondary | ICD-10-CM | POA: Diagnosis not present

## 2024-02-13 DIAGNOSIS — G43009 Migraine without aura, not intractable, without status migrainosus: Secondary | ICD-10-CM | POA: Diagnosis not present

## 2024-02-14 ENCOUNTER — Encounter: Payer: Self-pay | Admitting: Radiology

## 2024-03-23 ENCOUNTER — Encounter (HOSPITAL_COMMUNITY): Payer: Self-pay | Admitting: Family Medicine

## 2024-03-23 ENCOUNTER — Other Ambulatory Visit (HOSPITAL_COMMUNITY): Payer: Self-pay | Admitting: Family Medicine

## 2024-03-23 DIAGNOSIS — G43919 Migraine, unspecified, intractable, without status migrainosus: Secondary | ICD-10-CM

## 2024-03-25 ENCOUNTER — Ambulatory Visit
Admission: RE | Admit: 2024-03-25 | Discharge: 2024-03-25 | Disposition: A | Discharge status: Home / Self Care | Source: Ambulatory Visit | Attending: Family Medicine | Admitting: Family Medicine

## 2024-03-25 DIAGNOSIS — G43919 Migraine, unspecified, intractable, without status migrainosus: Secondary | ICD-10-CM

## 2024-03-25 DIAGNOSIS — G43909 Migraine, unspecified, not intractable, without status migrainosus: Secondary | ICD-10-CM | POA: Diagnosis not present

## 2024-03-31 ENCOUNTER — Other Ambulatory Visit: Payer: Self-pay | Admitting: Family Medicine

## 2024-03-31 DIAGNOSIS — G43919 Migraine, unspecified, intractable, without status migrainosus: Secondary | ICD-10-CM

## 2024-03-31 DIAGNOSIS — R519 Headache, unspecified: Secondary | ICD-10-CM | POA: Diagnosis not present

## 2024-04-03 ENCOUNTER — Encounter: Payer: Self-pay | Admitting: Family Medicine

## 2024-04-16 ENCOUNTER — Ambulatory Visit
Admission: RE | Admit: 2024-04-16 | Discharge: 2024-04-16 | Disposition: A | Discharge status: Home / Self Care | Source: Ambulatory Visit | Attending: Family Medicine | Admitting: Family Medicine

## 2024-04-16 DIAGNOSIS — G43919 Migraine, unspecified, intractable, without status migrainosus: Secondary | ICD-10-CM

## 2024-04-16 DIAGNOSIS — R519 Headache, unspecified: Secondary | ICD-10-CM | POA: Diagnosis not present

## 2024-04-27 ENCOUNTER — Encounter: Payer: Self-pay | Admitting: Radiology

## 2024-07-22 ENCOUNTER — Ambulatory Visit: Admitting: Orthopedic Surgery

## 2024-07-22 ENCOUNTER — Encounter: Payer: Self-pay | Admitting: Orthopedic Surgery

## 2024-07-22 ENCOUNTER — Other Ambulatory Visit (INDEPENDENT_AMBULATORY_CARE_PROVIDER_SITE_OTHER): Payer: Self-pay

## 2024-07-22 VITALS — BP 106/69 | HR 68 | Ht 65.0 in | Wt 222.0 lb

## 2024-07-22 DIAGNOSIS — M1711 Unilateral primary osteoarthritis, right knee: Secondary | ICD-10-CM | POA: Diagnosis not present

## 2024-07-22 DIAGNOSIS — G8929 Other chronic pain: Secondary | ICD-10-CM

## 2024-07-22 DIAGNOSIS — M25561 Pain in right knee: Secondary | ICD-10-CM | POA: Diagnosis not present

## 2024-07-22 MED ORDER — METHYLPREDNISOLONE ACETATE 40 MG/ML IJ SUSP
40.0000 mg | Freq: Once | INTRAMUSCULAR | Status: AC
  Administered 2024-07-22: 40 mg via INTRA_ARTICULAR

## 2024-07-22 NOTE — Progress Notes (Addendum)
 "  Office Visit Note   Patient: Tamara Terry           Date of Birth: 23-Oct-1960           MRN: [id number] Visit Date: 07/22/2024 Requested by: Shona Norleen PEDLAR, MD [address] [place],  [place] [address] PCP: Shona Norleen PEDLAR, MD   Assessment & Plan:   Encounter Diagnoses  Name Primary?   Acute pain of right knee Yes   Primary osteoarthritis of right knee     No orders of the defined types were placed in this encounter.  64 year old female with osteoarthritis of the right knee  Plan  Continue meloxicam  Start Tylenol  500 mg every 6 hours  Home exercise program for quadricep strengthening  Weight loss through diet control  Cortisone injection  Recheck 6 weeks  Procedure note right knee injection   verbal consent was obtained to inject right knee joint  Timeout was completed to confirm the site of injection  The medications used were depomedrol 40 mg and 1% lidocaine  3 cc Anesthesia was provided by ethyl chloride and the skin was prepped with alcohol.  After cleaning the skin with alcohol a 20-gauge needle was used to inject the right knee joint. There were no complications. A sterile bandage was applied.   Subjective: Chief Complaint  Patient presents with   Knee Pain    Right     HPI: Tamara Terry is 64 years old she presents with a 26-month history of atraumatic onset of pain over the medial side of her right knee.  The patient is already on meloxicam 1500 mg a day for issues with her feet  The pain is over the medial side of the knee but not localized.  It is worse at night very uncomfortable it is increased with weightbearing  She is dealing with some sciatica but has no groin pain              ROS: As stated   Images personally read and my interpretation :  DG Knee AP/LAT W/Sunrise Right Result Date: 07/22/2024 Image report of the right knee Chief complaint right knee pain medial aspect for 2 months no injury reported AP both knees lateral and sunrise right  knee Alignment looks normal in both knees No obvious joint space narrowing There are osteophytes medially on both knees laterally on both knees and there is peaking of the tibial spines, there is narrowing of the patellofemoral joint especially medially Overall impression osteoarthritis of the right knee and left knee, grade 3 disease     Visit Diagnoses:  1. Acute pain of right knee   2. Primary osteoarthritis of right knee      Follow-Up Instructions: Return in about 6 weeks (around 09/02/2024) for FOLLOW UP, RIGHT, KNEE, OA .    Objective: Vital Signs: BP 106/69   Pulse 68   Ht 5' 5 (1.651 m)   Wt 222 lb (100.7 kg)   BMI 36.94 kg/m   Physical Exam Vitals and nursing note reviewed.  Constitutional:      Appearance: Normal appearance.  HENT:     Head: Normocephalic and atraumatic.  Eyes:     General: No scleral icterus.       Right eye: No discharge.        Left eye: No discharge.     Extraocular Movements: Extraocular movements intact.     Conjunctiva/sclera: Conjunctivae normal.     Pupils: Pupils are equal, round, and reactive to  light.  Cardiovascular:     Rate and Rhythm: Normal rate.     Pulses: Normal pulses.  Musculoskeletal:     Right knee: No effusion.     Instability Tests: Medial McMurray test negative and lateral McMurray test negative.  Skin:    General: Skin is warm and dry.     Capillary Refill: Capillary refill takes less than 2 seconds.  Neurological:     General: No focal deficit present.     Mental Status: She is alert and oriented to person, place, and time.     Sensory: No sensory deficit.     Motor: No weakness.     Gait: Gait normal.  Psychiatric:        Mood and Affect: Affect is flat.        Behavior: Behavior normal.        Thought Content: Thought content normal.        Judgment: Judgment normal.      Right Ankle Exam  Right ankle exam is normal.  Tenderness  The patient is experiencing no tenderness. Swelling: none  Range  of Motion  The patient has normal right ankle ROM.  Muscle Strength  The patient has normal right ankle strength.  Tests  Anterior drawer: negative Varus tilt: negative  Other  Erythema: absent Scars: absent Sensation: normal Pulse: present    Left Ankle Exam  Left ankle exam is normal.  Tenderness  The patient is experiencing no tenderness.  Swelling: none  Range of Motion  The patient has normal left ankle ROM.   Muscle Strength  The patient has normal left ankle strength.  Tests  Anterior drawer: negative Varus tilt: negative  Other  Erythema: absent Scars: absent Sensation: normal Pulse: present   Right Knee Exam   Muscle Strength  The patient has normal right knee strength.  Tenderness  The patient is experiencing tenderness in the pes anserinus and medial joint line (Medial joint line is tender medial femoral condyle is tender medial soft tissues are tender).  Range of Motion  Extension:  normal  Flexion:  120   Tests  McMurray:  Medial - negative Lateral - negative Varus: negative Valgus: negative Drawer:  Anterior - negative    Posterior - negative  Other  Erythema: absent Scars: absent Sensation: normal Pulse: present Swelling: none Effusion: no effusion present       Specialty Comments:  No specialty comments available.  Imaging: DG Knee AP/LAT W/Sunrise Right Result Date: 07/22/2024 Image report of the right knee Chief complaint right knee pain medial aspect for 2 months no injury reported AP both knees lateral and sunrise right knee Alignment looks normal in both knees No obvious joint space narrowing There are osteophytes medially on both knees laterally on both knees and there is peaking of the tibial spines, there is narrowing of the patellofemoral joint especially medially Overall impression osteoarthritis of the right knee and left knee, grade 3 disease     PMFS History: Patient Active Problem List   Diagnosis Date  Noted   Status post parathyroidectomy 09/10/2023   Primary hyperparathyroidism 08/22/2023   Hyperparathyroidism, primary 08/15/2023   Age-related osteoporosis without current pathological fracture 02/26/2023   Screening mammogram for breast cancer 08/16/2022   Screening for colorectal cancer 08/16/2022   Encounter for screening fecal occult blood testing 08/16/2022   Encounter for gynecological examination with Papanicolaou smear of cervix 08/16/2022   Labial abscess 08/16/2022   Obesity 04/12/2014   DIARRHEA 08/01/2010  ABDOMINAL PAIN, EPIGASTRIC 08/01/2010   Past Medical History:  Diagnosis Date   Arthritis    Chronic back pain    Depression    GERD (gastroesophageal reflux disease)    Interstitial cystitis    Neuropathy of both feet    Obesity    Plantar fasciitis, bilateral    SVT (supraventricular tachycardia)     Family History  Problem Relation Age of Onset   Heart disease Mother    Diabetes Mother    Hypertension Mother    Parkinson's disease Mother    Kidney disease Mother    Colon polyps Mother    Heart attack Father    Diabetes Sister    Diabetes Maternal Grandmother    Cancer Maternal Grandfather     Past Surgical History:  Procedure Laterality Date   CHOLECYSTECTOMY  06/25/1988   CYSTO WITH HYDRODISTENSION  11/07/2011   Procedure: CYSTOSCOPY/HYDRODISTENSION;  Surgeon: Garnette Shack, MD;  Location: [facility];  Service: Urology;  Laterality: N/A;  PYRIDIUM  AND MARCAINE    HYSTEROSCOPY WITH D & C  07/21/2004   MASTECTOMY     PARATHYROIDECTOMY Left 08/22/2023   Procedure: LEFT INFERIOR PARATHYROIDECTOMY;  Surgeon: Eletha Boas, MD;  Location: [facility];  Service: General;  Laterality: Left;   Social History   Occupational History   Not on file  Tobacco Use   Smoking status: Former    Current packs/day: 0.00    Types: Cigarettes    Quit date: 06/25/1994    Years since quitting: 30.0   Smokeless tobacco: Never  Vaping Use   Vaping  status: Never Used  Substance and Sexual Activity   Alcohol use: Not Currently   Drug use: No   Sexual activity: Not Currently    Birth control/protection: Post-menopausal       "

## 2024-07-22 NOTE — Addendum Note (Signed)
 Addended byBETHA JENEAN GREIG LELON on: 07/22/2024 09:40 AM   Modules accepted: Orders

## 2024-07-22 NOTE — Patient Instructions (Addendum)
 Treatment plan  Continue meloxicam 15 mg daily  Start Tylenol  500 mg every 6 hours  Start quadriceps exercises daily  Start weight loss program address with diet control of carbs including bread and Pasta, remove all sugary drinks Broiled, baked chicken, fish  Recheck knee in 6 weeks  Joint Steroid Injection A joint steroid injection is a procedure to relieve swelling and pain in a joint. Steroids are medicines that reduce inflammation. In this procedure, your health care provider uses a syringe and a needle to inject a steroid medicine into a painful and inflamed joint. A pain-relieving medicine (anesthetic) may be injected along with the steroid. In some cases, your health care provider may use an imaging technique such as ultrasound or fluoroscopy to guide the injection. Joints that are often treated with steroid injections include the knee, shoulder, hip, and spine. These injections may also be used in the elbow, ankle, and joints of the hands or feet. You may have joint steroid injections as part of your treatment for inflammation caused by: Gout. Rheumatoid arthritis. Advanced wear-and-tear arthritis (osteoarthritis). Tendinitis. Bursitis. Joint steroid injections may be repeated, but having them too often can damage a joint or the skin over the joint. You should not have joint steroid injections less than 6 weeks apart or more than four times a year. Tell a health care provider about: Any allergies you have. All medicines you are taking, including vitamins, herbs, eye drops, creams, and over-the-counter medicines. Any problems you or family members have had with anesthetic medicines. Any blood disorders you have. Any surgeries you have had. Any medical conditions you have. Whether you are pregnant or may be pregnant. What are the risks? Generally, this is a safe treatment. However, problems may occur, including: Infection. Bleeding. Allergic reactions to medicines. Damage to  the joint or tissues around the joint. Thinning of skin or loss of skin color over the joint. Temporary flushing of the face or chest. Temporary increase in pain. Temporary increase in blood sugar. Failure to relieve inflammation or pain. What happens before the treatment? Medicines Ask your health care provider about: Changing or stopping your regular medicines. This is especially important if you are taking diabetes medicines or blood thinners. Taking medicines such as aspirin and ibuprofen. These medicines can thin your blood. Do not take these medicines unless your health care provider tells you to take them. Taking over-the-counter medicines, vitamins, herbs, and supplements. General instructions You may have imaging tests of your joint. Ask your health care provider if you can drive yourself home after the procedure. What happens during the treatment?  Your health care provider will position you for the injection and locate the injection site over your joint. The skin over the joint will be cleaned with a germ-killing soap. Your health care provider may: Spray a numbing solution (topical anesthetic) over the injection site. Inject a local anesthetic under the skin above your joint. The needle will be placed through your skin into your joint. Your health care provider may use imaging to guide the needle to the right spot for the injection. If imaging is used, a special contrast dye may be injected to confirm that the needle is in the correct location. The steroid medicine will be injected into your joint. Anesthetic may be injected along with the steroid. This may be a medicine that relieves pain for a short time (short-acting anesthetic) or for a longer time (long-acting anesthetic). The needle will be removed, and an adhesive bandage (dressing) will be  placed over the injection site. The procedure may vary among health care providers and hospitals. What can I expect after the  treatment? You will be able to go home after the treatment. It is normal to feel slight flushing for a few days after the injection. After the treatment, it is common to have an increase in joint pain after the anesthetic has worn off. This may happen about an hour after a short-acting anesthetic or about 8 hours after a longer-acting anesthetic. You should begin to feel relief from joint pain and swelling after 24 to 48 hours. Contact your health care provider if you do not begin to feel relief after 2 days. Follow these instructions at home: Injection site care Leave the adhesive dressing over your injection site in place until your health care provider says you can remove it. Check your injection site every day for signs of infection. Check for: More redness, swelling, or pain. Fluid or blood. Warmth. Pus or a bad smell. Activity Return to your normal activities as told by your health care provider. Ask your health care provider what activities are safe for you. You may be asked to limit activities that put stress on the joint for a few days. Do joint exercises as told by your health care provider. Do not take baths, swim, or use a hot tub until your health care provider approves. Ask your health care provider if you may take showers. You may only be allowed to take sponge baths. Managing pain, stiffness, and swelling  If directed, put ice on the joint. To do this: Put ice in a plastic bag. Place a towel between your skin and the bag. Leave the ice on for 20 minutes, 2-3 times a day. Remove the ice if your skin turns bright red. This is very important. If you cannot feel pain, heat, or cold, you have a greater risk of damage to the area. Raise (elevate) your joint above the level of your heart when you are sitting or lying down. General instructions Take over-the-counter and prescription medicines only as told by your health care provider. Do not use any products that contain nicotine  or tobacco, such as cigarettes, e-cigarettes, and chewing tobacco. These can delay joint healing. If you need help quitting, ask your health care provider. If you have diabetes, be aware that your blood sugar may be slightly elevated for several days after the injection. Keep all follow-up visits. This is important. Contact a health care provider if you have: Chills or a fever. Any signs of infection at your injection site. Increased pain or swelling or no relief after 2 days. Summary A joint steroid injection is a treatment to relieve pain and swelling in a joint. Steroids are medicines that reduce inflammation. Your health care provider may add an anesthetic along with the steroid. You may have joint steroid injections as part of your arthritis treatment. Joint steroid injections may be repeated, but having them too often can damage a joint or the skin over the joint. Contact your health care provider if you have a fever, chills, or signs of infection, or if you get no relief from joint pain or swelling. This information is not intended to replace advice given to you by your health care provider. Make sure you discuss any questions you have with your health care provider. Document Revised: 11/20/2019 Document Reviewed: 11/20/2019 Elsevier Patient Education  2024 [employer].

## 2024-07-22 NOTE — Progress Notes (Signed)
" °  Intake history:  Chief Complaint  Patient presents with   Knee Pain    Right      Ht 5' 5 (1.651 m)   Wt 222 lb (100.7 kg)   BMI 36.94 kg/m  Body mass index is 36.94 kg/m.  Pharmacy? ___[place]___________________________________  WHAT ARE WE SEEING YOU FOR TODAY?   Right knee  How long has this bothered you? (DOI?DOS?WS?)  Couple mo  Was there an injury? No  Anticoag.  No   Any ALLERGIES ______________Allergies[1] ________________________________   Treatment:  Have you taken:  Tylenol  No  Advil No  Had PT No  Had injection No  Other  __________Meloxicam _______________        [1]  Allergies Allergen Reactions   Azithromycin    Latex    "

## 2024-09-02 ENCOUNTER — Ambulatory Visit: Admitting: Orthopedic Surgery
